# Patient Record
Sex: Female | Born: 1995 | Race: White | Hispanic: No | Marital: Single | Smoking: Never smoker
Health system: Southern US, Community
[De-identification: ages and names within clinical notes are randomized; demographics above are authoritative.]

## PROBLEM LIST (undated history)

## (undated) DIAGNOSIS — F419 Anxiety disorder, unspecified: Secondary | ICD-10-CM

## (undated) DIAGNOSIS — F909 Attention-deficit hyperactivity disorder, unspecified type: Secondary | ICD-10-CM

## (undated) DIAGNOSIS — T7840XA Allergy, unspecified, initial encounter: Secondary | ICD-10-CM

## (undated) HISTORY — DX: Attention-deficit hyperactivity disorder, unspecified type: F90.9

## (undated) HISTORY — PX: TONSILLECTOMY AND ADENOIDECTOMY: SHX28

## (undated) HISTORY — DX: Anxiety disorder, unspecified: F41.9

## (undated) HISTORY — DX: Allergy, unspecified, initial encounter: T78.40XA

---
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
## 2015-01-15 ENCOUNTER — Encounter: Payer: Self-pay | Admitting: Internal Medicine

## 2017-04-17 ENCOUNTER — Encounter: Payer: Self-pay | Admitting: Physician Assistant

## 2017-04-17 ENCOUNTER — Ambulatory Visit (INDEPENDENT_AMBULATORY_CARE_PROVIDER_SITE_OTHER): Payer: BC Managed Care – PPO | Admitting: Physician Assistant

## 2017-04-17 VITALS — BP 112/75 | HR 80 | Temp 97.9°F | Resp 16 | Ht 65.0 in | Wt 162.6 lb

## 2017-04-17 DIAGNOSIS — F410 Panic disorder [episodic paroxysmal anxiety] without agoraphobia: Secondary | ICD-10-CM | POA: Diagnosis not present

## 2017-04-17 DIAGNOSIS — F902 Attention-deficit hyperactivity disorder, combined type: Secondary | ICD-10-CM | POA: Diagnosis not present

## 2017-04-17 DIAGNOSIS — F411 Generalized anxiety disorder: Secondary | ICD-10-CM | POA: Diagnosis not present

## 2017-04-17 MED ORDER — ALPRAZOLAM 0.5 MG PO TABS: 0.2500 mg | ORAL_TABLET | Freq: Every evening | ORAL | 0 refills | Status: DC | PRN

## 2017-04-17 MED ORDER — DESVENLAFAXINE SUCCINATE ER 50 MG PO TB24: 50.0000 mg | ORAL_TABLET | Freq: Every day | ORAL | 1 refills | Status: DC

## 2017-04-17 NOTE — Progress Notes (Signed)
Colleen Fuller  MRN: [id number] DOB: 1996/02/02  PCP: Patient, No Pcp Per  Chief Complaint  Patient presents with  . Anxiety    for 2 years ago     Subjective:  Pt presents to clinic for recheck of anxiety.  Pt is a Consulting civil engineer at [employer] and she has been seen in her student health by me.  She has GAD with panic and ADHD.  She is treated at the [facility] attention specialist for her ADHD and while treating her her anxiety worsened and they started her on Zoloft - which did not help her anxiety.  At [employer] college over the last semester she was started on lexapro that helped her anxiety but she was still having panic attacks.  Atarax made her to sleepy to function at school.  Buspar was tried and she felt depressive symptoms that she has never had before starting that medication - when she stopped this medication the depressive symptoms completely resolved.  She has recently (within the last 3 weeks) started pristiq and she seems to be tolerating it ok.  Her anxiety is slightly increased with the recent titration off Lexapro in order to start the Pristiq. She has taken Xanax 0.25 about 6 pills in the last 3 weeks - she tries to take them before she gets to the panic attack stage - she is able to talk herself out of them most of the time and the physical symptoms do not occur.     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  She has been on many doses of Adderall XR - when she was on 25mg  her anxiety was much increased but on the 10mg  she feels like her anxiety is better.  She takes a single dose in the am - it lasts about 6 hours - she has a lot of stuff that she needs to focus on that she has to do after the medications ran out.  She has most of her anxiety in the late afternoon/evening. She has noticed a link between her focus/concentration and some of her anxiety. -- she has had ADHD all her life  but she started for college because she wanted to succeed.   She sleeps fine at night even when she takes her Adderal dose in the late afternoon.  Anxiety comes from  - why she cannot get work done - which leads to I am a failure   Pt is wondering if she would prefer to go to a psychiatrist that can treat both her ADHD and her anxiety.   Review of Systems  Psychiatric/Behavioral: Positive for decreased concentration and sleep disturbance. Negative for dysphoric mood and suicidal ideas. The patient is nervous/anxious.     Patient Active Problem List   Diagnosis Date Noted  . GAD (generalized anxiety disorder) 04/17/2017  . Attention deficit hyperactivity disorder (ADHD), combined type 04/17/2017  . Panic attacks 04/17/2017    No current outpatient prescriptions on file prior to visit.   No current facility-administered medications on file prior to visit.     No Known Allergies  Pt patients past, family and social history were reviewed and updated.   Objective:  BP 112/75 (BP Location: Right Arm, Patient Position: Sitting, Cuff Size: Small)   Pulse 80   Temp 97.9 F (36.6 C) (Oral)   Resp 16   Ht 5\' 5"  (1.651 m)  Wt 162 lb 9.6 oz (73.8 kg)   LMP 04/08/2017   SpO2 100%   BMI 27.06 kg/m   Physical Exam  Constitutional: She is oriented to person, place, and time and well-developed, well-nourished, and in no distress.  HENT:  Head: Normocephalic and atraumatic.  Right Ear: Hearing and external ear normal.  Left Ear: Hearing and external ear normal.  Eyes: Conjunctivae are normal.  Neck: Normal range of motion.  Pulmonary/Chest: Effort normal.  Neurological: She is alert and oriented to person, place, and time. Gait normal.  Skin: Skin is warm and dry.  Psychiatric: Mood, memory, affect and judgment normal.  Vitals reviewed.   Assessment and Plan :  GAD (generalized anxiety disorder) - Plan: desvenlafaxine (PRISTIQ) 50 MG 24 hr tablet  Attention deficit  hyperactivity disorder (ADHD), combined type  Panic attacks - Plan: desvenlafaxine (PRISTIQ) 50 MG 24 hr tablet, ALPRAZolam (XANAX) 0.5 MG tablet   Increase her Pristiq to 50mg  - pt should see some benefit in the 3-4 week time prior to her next visit - her dose may need to be increased at her next visit.  We discussed that she is going to be really aware of when her anxiety is and is not related her her lack of concentration and focus from her ADHD.  We discussed that her dose of Adderall may not be lasting long enough - ? Adderall XR 10mg  bid dosing or change to Mydayis or Vyvanse and she plans to monitor this and then [facility] for possible medication adjustment.  Gave her some names of specialist that can treatment all her conditions because I suspect they are more intertwined than previously thought.  Benny LennertSarah Ellyse Rotolo PA-C  Primary Care at [facility] 04/17/2017 10:21 AM

## 2017-04-17 NOTE — Patient Instructions (Addendum)
[place] [facility] --     IF you received an x-ray today, you will receive an invoice from [facility]. Please contact [facility] at [phone] with questions or concerns regarding your invoice.   IF you received labwork today, you will receive an invoice from [facility]. Please contact [facility] at [phone] with questions or concerns regarding your invoice.   Our billing staff will not be able to assist you with questions regarding bills from these companies.  You will be contacted with the lab results as soon as they are available. The fastest way to get your results is to activate your My Chart account. Instructions are located on the last page of this paperwork. If you have not heard from us regarding the results in 2 weeks, please contact this office.

## 2017-05-15 ENCOUNTER — Ambulatory Visit: Payer: BC Managed Care – PPO | Admitting: Physician Assistant

## 2017-05-28 ENCOUNTER — Telehealth: Payer: Self-pay | Admitting: Physician Assistant

## 2017-05-28 NOTE — Telephone Encounter (Signed)
Pt informed that she should have 1 refill left.

## 2017-05-28 NOTE — Telephone Encounter (Signed)
PATIENT WOULD LIKE SARAH TO KNOW THAT SHE NEEDS A REFILL ON HER DESVENLAFAXINE (PRISTIQ) 50 MG 24 HOUR TABLET. HER SCHEDULE FOR AN APPOINTMENT DID NOT WORK WITH SARAH'S FOR NEXT WEEK. AND SARAH IS ON VACATION THIS WEEK AND THE FOLLOWING WEEK. BEST PHONE [phone] (CELL) PHARMACY CHOICE IS [place]. [facility]BC

## 2017-06-11 ENCOUNTER — Encounter (INDEPENDENT_AMBULATORY_CARE_PROVIDER_SITE_OTHER): Payer: Self-pay | Admitting: Orthopaedic Surgery

## 2017-06-11 ENCOUNTER — Ambulatory Visit (INDEPENDENT_AMBULATORY_CARE_PROVIDER_SITE_OTHER): Payer: BC Managed Care – PPO | Admitting: Orthopaedic Surgery

## 2017-06-11 ENCOUNTER — Ambulatory Visit (INDEPENDENT_AMBULATORY_CARE_PROVIDER_SITE_OTHER): Payer: Self-pay

## 2017-06-11 DIAGNOSIS — M25571 Pain in right ankle and joints of right foot: Secondary | ICD-10-CM | POA: Diagnosis not present

## 2017-06-11 NOTE — Progress Notes (Signed)
Office Visit Note   Patient: Colleen Fuller           Date of Birth: 1996/11/15           MRN: [id number] Visit Date: 06/11/2017              Requested by: No referring provider defined for this encounter. PCP: Patient, No Pcp Per   Assessment & Plan: Visit Diagnoses:  1. Pain in right ankle and joints of right foot     Plan: Overall impression is posterior tibial tendinitis. Recommend physical therapy with modalities, schedule NSAIDs then as needed. Questions encouraged and answered. ASO brace was provided today. Follow-up as needed.  Follow-Up Instructions: Return if symptoms worsen or fail to improve.   Orders:  Orders Placed This Encounter  Procedures  . XR Ankle Complete Right   No orders of the defined types were placed in this encounter.     Procedures: No procedures performed   Clinical Data: No additional findings.   Subjective: Chief Complaint  Patient presents with  . 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 She denies any injuries. Denies any real swelling or numbness and tingling.    Review of Systems  Constitutional: Negative.   HENT: Negative.   Eyes: Negative.   Respiratory: Negative.   Cardiovascular: Negative.   Endocrine: Negative.   Musculoskeletal: Negative.   Neurological: Negative.   Hematological: Negative.   Psychiatric/Behavioral: Negative.   All other systems reviewed and are negative.    Objective: Vital Signs: There were no vitals taken for this visit.  Physical Exam  Constitutional: She is oriented to person, place, and time. She appears well-developed and well-nourished.  HENT:  Head: Normocephalic and atraumatic.  Eyes: EOM are normal.  Neck: Neck supple.  Pulmonary/Chest: Effort normal.  Abdominal: Soft.  Neurological: She is alert and oriented to person, place, and time.  Skin: Skin is warm. Capillary refill takes less  than 2 seconds.  Psychiatric: She has a normal mood and affect. Her behavior is normal. Judgment and thought content normal.  Nursing note and vitals reviewed.   Ortho Exam Right ankle exam shows tenderness along the posterior tibial tendon. Achilles is nontender. Plantar fascia is nontender. No evidence of significant swelling or skin changes. Foot is warm well-perfused. Specialty Comments:  No specialty comments available.  Imaging: Xr Ankle Complete Right  Result Date: 06/11/2017 No acute or structural findings    PMFS History: Patient Active Problem List   Diagnosis Date Noted  . GAD (generalized anxiety disorder) 04/17/2017  . Attention deficit hyperactivity disorder (ADHD), combined type 04/17/2017  . Panic attacks 04/17/2017   Past Medical History:  Diagnosis Date  . ADHD   . Allergy   . Anxiety     Family History  Problem Relation Age of Onset  . Mental illness Mother   . Cancer Maternal Grandmother     Past Surgical History:  Procedure Laterality Date  . TONSILLECTOMY AND ADENOIDECTOMY Bilateral    age 89   Social History   Occupational History  . student    Social History Main Topics  . Smoking status: Never Smoker  . Smokeless tobacco: Never Used  . Alcohol use Yes     Comment: 3 per year   . Drug use: Yes    Types: Marijuana     Comment: rarely   . Sexual activity: Not  on file

## 2017-06-27 ENCOUNTER — Encounter: Payer: Self-pay | Admitting: Physician Assistant

## 2017-06-27 ENCOUNTER — Ambulatory Visit (INDEPENDENT_AMBULATORY_CARE_PROVIDER_SITE_OTHER): Payer: BC Managed Care – PPO | Admitting: Physician Assistant

## 2017-06-27 VITALS — BP 112/70 | HR 75 | Temp 98.5°F | Resp 16 | Ht 65.0 in | Wt 164.2 lb

## 2017-06-27 DIAGNOSIS — F410 Panic disorder [episodic paroxysmal anxiety] without agoraphobia: Secondary | ICD-10-CM | POA: Diagnosis not present

## 2017-06-27 DIAGNOSIS — F902 Attention-deficit hyperactivity disorder, combined type: Secondary | ICD-10-CM

## 2017-06-27 DIAGNOSIS — F411 Generalized anxiety disorder: Secondary | ICD-10-CM | POA: Diagnosis not present

## 2017-06-27 MED ORDER — ALPRAZOLAM 0.5 MG PO TABS: 0.2500 mg | ORAL_TABLET | Freq: Every evening | ORAL | 0 refills | Status: DC | PRN

## 2017-06-27 MED ORDER — DESVENLAFAXINE SUCCINATE ER 50 MG PO TB24: 50.0000 mg | ORAL_TABLET | Freq: Every day | ORAL | 0 refills | Status: DC

## 2017-06-27 NOTE — Patient Instructions (Addendum)
[facility]   IF you received an x-ray today, you will receive an invoice from [facility]. Please contact [facility] at [phone] with questions or concerns regarding your invoice.   IF you received labwork today, you will receive an invoice from [facility]. Please contact [facility] at [phone] with questions or concerns regarding your invoice.   Our billing staff will not be able to assist you with questions regarding bills from these companies.  You will be contacted with the lab results as soon as they are available. The fastest way to get your results is to activate your My Chart account. Instructions are located on the last page of this paperwork. If you have not heard from us regarding the results in 2 weeks, please contact this office.

## 2017-06-27 NOTE — Progress Notes (Signed)
Colleen Fuller  MRN: [id number] DOB: 28-Oct-1996  PCP: Patient, No Pcp Per  Chief Complaint  Patient presents with  . Medication Refill    refill for Xanax and Prystiq    Subjective:  Pt presents to clinic for follow up on pristiq and xanax.  Patient transitioned from lexapro to pristiq. Likes the combination of pristiq and xanax. She takes the pristiq mid morning to noon. Before she takes the medication, she feels lightheaded and dizzy, then takes the medication with food and these symptoms go away. She has not been eating large breakfasts, only a piece of fruit. She has not tried eating a large meal in the morning to see if this makes a difference. She says that it is easier while in college to eat large meals because there is a cafeteria. She admits to drinking 2 cups a water a day and not does feel lightheaded or dizzy after standing up quickly.  Feels she has had less break through panic attacks because the medication makes them easier to handle. Her panic attacks most commonly happen at night, but feels they do not escalate to previous levels of panic. She attributes part of this to taking a xanax when needed, and she averages 1 xanax a week.  She only uses the xanax when she has not been able to stop them with her coping techniques that she has learned through therapy.  Pt is interested in STD testing but she really does not want blood work done - she has ahad gonorrhea and chlamydia but has not had other due to the blood draw testing.  Review of Systems  Eyes: Positive for visual disturbance (with panic attacks).  Musculoskeletal: Negative for myalgias.  Neurological: Positive for dizziness and light-headedness.  Psychiatric/Behavioral: Negative for dysphoric mood and sleep disturbance. The patient is nervous/anxious.     Patient Active Problem List   Diagnosis Date Noted  . GAD (generalized anxiety disorder) 04/17/2017  . Attention deficit hyperactivity disorder (ADHD),  combined type 04/17/2017  . Panic attacks 04/17/2017    Current Outpatient Prescriptions on File Prior to Visit  Medication Sig Dispense Refill  . amphetamine-dextroamphetamine (ADDERALL XR) 10 MG 24 hr capsule Take 10 mg by mouth daily.    . ORSYTHIA 0.1-20 MG-MCG tablet      No current facility-administered medications on file prior to visit.     No Known Allergies  Past Medical History:  Diagnosis Date  . ADHD   . Allergy   . Anxiety    Social History   Social History Narrative   [place] [employer] - major [employer]   Social History  Substance Use Topics  . Smoking status: Never Smoker  . Smokeless tobacco: Never Used  . Alcohol use Yes     Comment: 3 per year    family history includes Cancer in her maternal grandmother; Mental illness in her mother.     Objective:  BP 112/70 (BP Location: Right Arm, Patient Position: Sitting, Cuff Size: Normal)   Pulse 75   Temp 98.5 F (36.9 C) (Oral)   Resp 16   Ht 5\' 5"  (1.651 m)   Wt 164 lb 3.2 oz (74.5 kg)   LMP 06/27/2017   SpO2 98%   BMI 27.32 kg/m  Body mass index is 27.32 kg/m.  Physical Exam  Constitutional: She is oriented to person, place, and time and well-developed, well-nourished, and in no distress.  HENT:  Head: Normocephalic and atraumatic.  Right Ear: Hearing and external ear  normal.  Left Ear: Hearing and external ear normal.  Eyes: Conjunctivae are normal.  Neck: Normal range of motion.  Cardiovascular: Normal rate, regular rhythm and normal heart sounds.   No murmur heard. Pulmonary/Chest: Effort normal and breath sounds normal. She has no wheezes.  Neurological: She is alert and oriented to person, place, and time. Gait normal.  Skin: Skin is warm and dry.  Psychiatric: Mood, memory, affect and judgment normal.  Vitals reviewed.   Assessment and Plan :  Attention deficit hyperactivity disorder (ADHD), combined type  GAD (generalized anxiety disorder) - Plan: desvenlafaxine (PRISTIQ) 50  MG 24 hr tablet  Panic attacks - Plan: desvenlafaxine (PRISTIQ) 50 MG 24 hr tablet, ALPRAZolam (XANAX) 0.5 MG tablet  For now patients wants to keep her medication the same and wait for school to start to see if anything changes.  We did discuss that I think that we could increase her medication for an improved response not that she is using to much xanax but she is still having anxiety attacks daily.  She also plans to see how the semester goes but I think that she would benefit from a longer acting stimulant as I suspect that is part of her increase nighttime anxiety,  She needs to talk with [place] attention Specialist.   PT given names of locations that will likely do the HIV saliva test she will call and get that information.  Benny LennertSarah Hanna Aultman PA-C  Primary Care at [facility] 07/02/2017 1:48 PM

## 2017-10-24 ENCOUNTER — Encounter: Payer: Self-pay | Admitting: Family Medicine

## 2017-10-24 ENCOUNTER — Ambulatory Visit: Payer: BC Managed Care – PPO | Admitting: Family Medicine

## 2017-10-24 ENCOUNTER — Other Ambulatory Visit: Payer: Self-pay

## 2017-10-24 VITALS — BP 110/62 | HR 83 | Temp 98.7°F | Resp 17 | Ht 65.0 in | Wt 173.2 lb

## 2017-10-24 DIAGNOSIS — F411 Generalized anxiety disorder: Secondary | ICD-10-CM

## 2017-10-24 DIAGNOSIS — F410 Panic disorder [episodic paroxysmal anxiety] without agoraphobia: Secondary | ICD-10-CM

## 2017-10-24 DIAGNOSIS — F902 Attention-deficit hyperactivity disorder, combined type: Secondary | ICD-10-CM | POA: Diagnosis not present

## 2017-10-24 MED ORDER — DESVENLAFAXINE SUCCINATE ER 50 MG PO TB24: 50.0000 mg | ORAL_TABLET | Freq: Every day | ORAL | 0 refills | Status: DC

## 2017-10-24 MED ORDER — ALPRAZOLAM 0.5 MG PO TABS: 0.2500 mg | ORAL_TABLET | Freq: Every evening | ORAL | 0 refills | Status: DC | PRN

## 2017-10-24 NOTE — Patient Instructions (Signed)
     IF you received an x-ray today, you will receive an invoice from [facility]. Please contact [facility] at [phone] with questions or concerns regarding your invoice.   IF you received labwork today, you will receive an invoice from [facility]. Please contact [facility] at [phone] with questions or concerns regarding your invoice.   Our billing staff will not be able to assist you with questions regarding bills from these companies.  You will be contacted with the lab results as soon as they are available. The fastest way to get your results is to activate your My Chart account. Instructions are located on the last page of this paperwork. If you have not heard from us regarding the results in 2 weeks, please contact this office.     

## 2017-10-24 NOTE — Progress Notes (Signed)
Chief Complaint  Patient presents with  . Medication Refill    xanax, pristiq    HPI  Pt reports that her mood is related to where school is right now She is going through finals and is not sleeping well which is affecting her mood She reports that her anxiety is slightly worse since the end of the semester has come around She studies theatre  Pt has a diagnosis of ADD/ADHD She is taking adderall daily and her pristiq daily and has good results She does not use other stimulants such as energy drinks or caffeine She is able to sleep at night and denies unintentional weight loss She denies mood swings She skips doses when not working or on weekends  Wt Readings from Last 3 Encounters:  10/24/17 173 lb 3.2 oz (78.6 kg)  06/27/17 164 lb 3.2 oz (74.5 kg)  04/17/17 162 lb 9.6 oz (73.8 kg)     Past Medical History:  Diagnosis Date  . ADHD   . Allergy   . Anxiety     Current Outpatient Medications  Medication Sig Dispense Refill  . ALPRAZolam (XANAX) 0.5 MG tablet Take 0.5-1 tablets (0.25-0.5 mg total) by mouth at bedtime as needed for anxiety. 15 tablet 0  . amphetamine-dextroamphetamine (ADDERALL XR) 10 MG 24 hr capsule Take 10 mg by mouth daily.    Marland Kitchen. desvenlafaxine (PRISTIQ) 50 MG 24 hr tablet Take 1 tablet (50 mg total) by mouth daily. 90 tablet 0  . ORSYTHIA 0.1-20 MG-MCG tablet      No current facility-administered medications for this visit.     Allergies: Not on File  Past Surgical History:  Procedure Laterality Date  . TONSILLECTOMY AND ADENOIDECTOMY Bilateral    age 89    Social History   Socioeconomic History  . Marital status: Single    Spouse name: None  . Number of children: None  . Years of education: None  . Highest education level: None  Social Needs  . Financial resource strain: None  . Food insecurity - worry: None  . Food insecurity - inability: None  . Transportation needs - medical: None  . Transportation needs - non-medical: None    Occupational History  . Occupation: Consulting civil engineerstudent  Tobacco Use  . Smoking status: Never Smoker  . Smokeless tobacco: Never Used  Substance and Sexual Activity  . Alcohol use: Yes    Comment: 3 per year   . Drug use: Yes    Types: Marijuana    Comment: rarely   . Sexual activity: None  Other Topics Concern  . None  Social History Narrative   [place] [employer] student - major theater    Family History  Problem Relation Age of Onset  . Mental illness Mother   . Cancer Maternal Grandmother      ROS Review of Systems See HPI Constitution: No fevers or chills No malaise No diaphoresis Skin: No rash or itching Eyes: no blurry vision, no double vision GU: no dysuria or hematuria Neuro: no dizziness or headaches all others reviewed and negative   Objective: Vitals:   10/24/17 1433  BP: 110/62  Pulse: 83  Resp: 17  Temp: 98.7 F (37.1 C)  TempSrc: Oral  SpO2: 100%  Weight: 173 lb 3.2 oz (78.6 kg)  Height: 5\' 5"  (1.651 m)    Physical Exam  Constitutional: She is oriented to person, place, and time. She appears well-developed and well-nourished.  HENT:  Head: Normocephalic and atraumatic.  Eyes: Conjunctivae and EOM are normal.  Cardiovascular: Normal rate, regular rhythm and normal heart sounds.  Pulmonary/Chest: Effort normal and breath sounds normal. No stridor. No respiratory distress.  Neurological: She is alert and oriented to person, place, and time.  Psychiatric: She has a normal mood and affect. Her behavior is normal. Judgment and thought content normal.    Assessment and Plan [place] was seen today for medication refill.  Diagnoses and all orders for this visit:  GAD (generalized anxiety disorder)- continue current meds -     desvenlafaxine (PRISTIQ) 50 MG 24 hr tablet; Take 1 tablet (50 mg total) by mouth daily.  Panic attacks- advised cpm -     desvenlafaxine (PRISTIQ) 50 MG 24 hr tablet; Take 1 tablet (50 mg total) by mouth daily. -     ALPRAZolam  (XANAX) 0.5 MG tablet; Take 0.5-1 tablets (0.25-0.5 mg total) by mouth at bedtime as needed for anxiety.  Attention deficit hyperactivity disorder (ADHD), combined type- continue current meds       Rosamaria Donn A Creta LevinStallings

## 2019-11-10 ENCOUNTER — Other Ambulatory Visit: Payer: Self-pay

## 2019-11-10 DIAGNOSIS — Z20822 Contact with and (suspected) exposure to covid-19: Secondary | ICD-10-CM

## 2019-11-11 LAB — NOVEL CORONAVIRUS, NAA: SARS-CoV-2, NAA: NOT DETECTED

## 2020-01-17 ENCOUNTER — Ambulatory Visit: Payer: Self-pay | Attending: Internal Medicine

## 2020-01-17 DIAGNOSIS — Z23 Encounter for immunization: Secondary | ICD-10-CM | POA: Insufficient documentation

## 2020-01-17 NOTE — Progress Notes (Signed)
   Covid-19 Vaccination Clinic  Name:  Colleen Fuller    MRN: [id number] DOB: Jul 09, 1996  01/17/2020  Colleen Fuller was observed post Covid-19 immunization for 15 minutes without incidence. She was provided with Vaccine Information Sheet and instruction to access the V-Safe system.   Colleen Fuller was instructed to call 911 with any severe reactions post vaccine: Marland Kitchen Difficulty breathing  . Swelling of your face and throat  . A fast heartbeat  . A bad rash all over your body  . Dizziness and weakness    Immunizations Administered    Name Date Dose VIS Date Route   Pfizer COVID-19 Vaccine 01/17/2020  9:19 AM 0.3 mL 10/31/2019 Intramuscular   Manufacturer: [employer], [employer]   Lot: [id number]   NDC: [address]-4

## 2020-02-07 ENCOUNTER — Ambulatory Visit: Payer: Self-pay | Attending: Internal Medicine

## 2020-02-07 ENCOUNTER — Ambulatory Visit: Payer: Self-pay

## 2020-02-07 DIAGNOSIS — Z23 Encounter for immunization: Secondary | ICD-10-CM

## 2020-02-07 NOTE — Progress Notes (Signed)
   Covid-19 Vaccination Clinic  Name:  Colleen Fuller    MRN: [id number] DOB: 1995-12-12  02/07/2020  Colleen Fuller was observed post Covid-19 immunization for 15 minutes without incident. She was provided with Vaccine Information Sheet and instruction to access the V-Safe system.   Colleen Fuller was instructed to call 911 with any severe reactions post vaccine: Marland Kitchen Difficulty breathing  . Swelling of face and throat  . A fast heartbeat  . A bad rash all over body  . Dizziness and weakness   Immunizations Administered    Name Date Dose VIS Date Route   Pfizer COVID-19 Vaccine 02/07/2020  1:06 PM 0.3 mL 10/31/2019 Intramuscular   Manufacturer: [employer], [employer]   Lot: [id number]   NDC: [address]-0

## 2020-02-11 ENCOUNTER — Ambulatory Visit: Payer: Self-pay

## 2020-03-24 ENCOUNTER — Ambulatory Visit: Payer: Self-pay | Admitting: Family Medicine

## 2020-07-12 ENCOUNTER — Encounter: Payer: Self-pay | Admitting: Family Medicine

## 2020-07-12 ENCOUNTER — Other Ambulatory Visit: Payer: Self-pay

## 2020-07-12 ENCOUNTER — Ambulatory Visit (INDEPENDENT_AMBULATORY_CARE_PROVIDER_SITE_OTHER): Payer: BC Managed Care – PPO | Admitting: Family Medicine

## 2020-07-12 VITALS — BP 132/82 | HR 91 | Temp 99.8°F | Resp 17 | Ht 66.0 in | Wt 221.0 lb

## 2020-07-12 DIAGNOSIS — F410 Panic disorder [episodic paroxysmal anxiety] without agoraphobia: Secondary | ICD-10-CM

## 2020-07-12 DIAGNOSIS — F411 Generalized anxiety disorder: Secondary | ICD-10-CM | POA: Diagnosis not present

## 2020-07-12 DIAGNOSIS — F902 Attention-deficit hyperactivity disorder, combined type: Secondary | ICD-10-CM

## 2020-07-12 DIAGNOSIS — E669 Obesity, unspecified: Secondary | ICD-10-CM

## 2020-07-12 MED ORDER — ALPRAZOLAM 0.5 MG PO TABS: 0.5000 mg | ORAL_TABLET | Freq: Every day | ORAL | 0 refills | Status: AC | PRN

## 2020-07-12 NOTE — Patient Instructions (Signed)
Please return in 3-6 months for a physical if you are comfortable.   I have ordered xanax for you to use for panic attacks.  I believe working on improving your mental health with Verlon Au or another psychiatrist is important. Let me know if I can help.  [facility] is a great group to consider.   If you have any questions or concerns, please don't hesitate to send me a message via MyChart or call the office at [phone]. Thank you for visiting with [place] today! It's our pleasure caring for you.

## 2020-07-12 NOTE — Progress Notes (Signed)
Subjective  CC:  Chief Complaint  Patient presents with  . New Patient (Initial Visit)    Colleen Fuller  . Anxiety    Colleen Fuller manages meds     HPI: Colleen Fuller is a 24 y.o. female who presents to [facility] today to establish care with me as a new patient.   She has the following concerns or needs:  24 year old female here to establish care.  Last complete physical was several years ago.  She suffers from general anxiety disorder and possibly other mental health disorders and panic attacks.  Currently seeing psychiatry who has her on Abilify and buspirone.  Unfortunately she does not feel that her mood is well controlled.  She suffers from panic attacks at least monthly.  Triggers include doctor visits, blood draws and needles.  She has been to an OB/GYN in the past but could not complete a pelvic exam due to a panic attack.  She has been sexually active although it was not a completely appropriate experience.  She denies forced sexual intercourse but says "it was mostly what he wanted".  To my knowledge, she has not had a Pap smear.  She is overdue for a physical exam.  She does want to make sure that she is healthy.  Physically she reports that she feels well.  She has been diagnosed with ADD but is no longer on medications.  She has been treated by [place] attention specialist.  In the past, she used Xanax about monthly for panic attacks.  Currently, her psychiatrist will not prescribe this for her.  I did review her [place] drug database and it was appropriate.  Social history: She will be moving into an apartment within the next month and will be living independently.  She is happy about this.  She is a Nurse, learning disability.  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  She did graduate from college last year.  She has a good relationship with her mother lives in [place].  Her father lives in [place].  She has  a brother.  She reports a good social system and network of friends.  She does not use drugs or alcohol.  She does not smoke.  Assessment  1. Panic attack   2. GAD (generalized anxiety disorder)   3. Attention deficit hyperactivity disorder (ADHD), combined type   4. Obesity (BMI 30-39.9)      Plan   Panic attacks General anxiety disorder: Acute panic attack triggered by the thought of a blood draw in the office.  Deep breathing exercises done.  Recommend Xanax for use for panic attacks.  She will follow-up with psychiatry.  Unfortunately her mood is not well controlled currently.  ADD: She defers treatment for this at this time.  Wellness: She is due for a wellness visit.  We will need to get her mood and panic under control first.  Follow up: CPE at her convenience when she is comfortable. No orders of the defined types were placed in this encounter.  Meds ordered this encounter  Medications  . ALPRAZolam (XANAX) 0.5 MG tablet    Sig: Take 1 tablet (0.5 mg total) by mouth daily as needed for anxiety.    Dispense:  30 tablet    Refill:  0     Depression screen [facility] 2/9 10/24/2017 06/27/2017 04/17/2017  Decreased Interest 1 0 0  Down, Depressed, Hopeless 1 0 0  PHQ - 2  Score 2 0 0  Altered sleeping 0 - -  Tired, decreased energy 2 - -  Change in appetite 1 - -  Feeling bad or failure about yourself  2 - -  Trouble concentrating 2 - -  Moving slowly or fidgety/restless 1 - -  Suicidal thoughts 0 - -  PHQ-9 Score 10 - -    We updated and reviewed the patient's past history in detail and it is documented below.  Patient Active Problem List   Diagnosis Date Noted  . Obesity (BMI 30-39.9) 07/12/2020  . GAD (generalized anxiety disorder) 04/17/2017    Zoloft - no help Lexapro - helped but still had panic Buspar - depressive SE  Current Pristiq   . Attention deficit hyperactivity disorder (ADHD), combined type 04/17/2017    [place] Attention Specialist   . Panic attacks  04/17/2017   Health Maintenance  Topic Date Due  . Hepatitis C Screening  Never done  . HIV Screening  Never done  . TETANUS/TDAP  Never done  . PAP-Cervical Cytology Screening  Never done  . PAP SMEAR-Modifier  Never done  . INFLUENZA VACCINE  06/20/2020  . COVID-19 Vaccine  Completed   Immunization History  Administered Date(s) Administered  . PFIZER SARS-COV-2 Vaccination 01/17/2020, 02/07/2020   Current Meds  Medication Sig  . ARIPiprazole (ABILIFY) 15 MG tablet Take 15 mg by mouth daily.  . busPIRone (BUSPAR) 10 MG tablet Take 10 mg by mouth 2 (two) times daily.    Allergies: Patient has no allergies on file. Past Medical History Patient  has a past medical history of ADHD, Allergy, and Anxiety. Past Surgical History Patient  has a past surgical history that includes Tonsillectomy and adenoidectomy (Bilateral). Family History: Patient family history includes Cancer in her maternal grandmother; Mental illness in her mother. Social History:  Patient  reports that she has never smoked. She has never used smokeless tobacco. She reports current alcohol use. She reports current drug use. Drug: Marijuana.  Review of Systems: Constitutional: negative for fever or malaise Ophthalmic: negative for photophobia, double vision or loss of vision Cardiovascular: negative for chest pain, dyspnea on exertion, or new LE swelling Respiratory: negative for SOB or persistent cough Gastrointestinal: negative for abdominal pain, change in bowel habits or melena Genitourinary: negative for dysuria or gross hematuria Musculoskeletal: negative for new gait disturbance or muscular weakness Integumentary: negative for new or persistent rashes Neurological: negative for TIA or stroke symptoms Psychiatric: negative for SI or delusions Allergic/Immunologic: negative for hives  Patient Care Team    Relationship Specialty Notifications Start End  Willow Ora, MD PCP - General Family Medicine   07/12/20     Objective  Vitals: BP 132/82   Pulse 91   Temp 99.8 F (37.7 C) (Temporal)   Resp 17   Ht 5\' 6"  (1.676 m)   Wt 221 lb (100.2 kg)   SpO2 98%   BMI 35.67 kg/m  General:  Well developed, well nourished, no acute distress initially Psych:  Alert and oriented,normal mood and affect, acute panic attack with hyperventilation crying and tearfulness during the office visit. Physical exam was thus deferred.  Commons side effects, risks, benefits, and alternatives for medications and treatment plan prescribed today were discussed, and the patient expressed understanding of the given instructions. Patient is instructed to call or message via MyChart if he/she has any questions or concerns regarding our treatment plan. No barriers to understanding were identified. We discussed Red Flag symptoms and signs in detail.  Patient expressed understanding regarding what to do in case of urgent or emergency type symptoms.   Medication list was reconciled, printed and provided to the patient in AVS. Patient instructions and summary information was reviewed with the patient as documented in the AVS. This note was prepared with assistance of Dragon voice recognition software. Occasional wrong-word or sound-a-like substitutions may have occurred due to the inherent limitations of voice recognition software  This visit occurred during the SARS-CoV-2 public health emergency.  Safety protocols were in place, including screening questions prior to the visit, additional usage of staff PPE, and extensive cleaning of exam room while observing appropriate contact time as indicated for disinfecting solutions.

## 2020-10-18 ENCOUNTER — Encounter: Payer: BC Managed Care – PPO | Admitting: Family Medicine

## 2020-10-26 ENCOUNTER — Other Ambulatory Visit: Payer: Self-pay

## 2020-10-26 ENCOUNTER — Encounter: Payer: Self-pay | Admitting: Family Medicine

## 2020-10-26 ENCOUNTER — Ambulatory Visit (INDEPENDENT_AMBULATORY_CARE_PROVIDER_SITE_OTHER): Payer: BC Managed Care – PPO | Admitting: Family Medicine

## 2020-10-26 VITALS — BP 134/88 | HR 91 | Temp 99.0°F | Ht 66.0 in | Wt 225.6 lb

## 2020-10-26 DIAGNOSIS — F40231 Fear of injections and transfusions: Secondary | ICD-10-CM | POA: Diagnosis not present

## 2020-10-26 DIAGNOSIS — Z0000 Encounter for general adult medical examination without abnormal findings: Secondary | ICD-10-CM | POA: Diagnosis not present

## 2020-10-26 DIAGNOSIS — F410 Panic disorder [episodic paroxysmal anxiety] without agoraphobia: Secondary | ICD-10-CM | POA: Diagnosis not present

## 2020-10-26 DIAGNOSIS — F411 Generalized anxiety disorder: Secondary | ICD-10-CM | POA: Diagnosis not present

## 2020-10-26 NOTE — Progress Notes (Signed)
Subjective  Chief Complaint  Patient presents with  . Annual Exam    fasting    HPI: Colleen Fuller is a 24 y.o. female who presents to [facility] at [facility] today for a Female Wellness Visit.  She also has the concerns and/or needs as listed above in the chief complaint. These will be addressed in addition to the Health Maintenance Visit.   Wellness Visit: annual visit with health maintenance review and exam without Pap  Health maintenance: Patient return for complete physical in spite of her severe anxiety symptoms.  She did take Xanax 0.5 mg prior to this visit.  She remains very anxious, tearful and panic.  She denies history of prior abuse that she is aware of.  She is due immunizations and Pap smear by does not feel like she could have these done today due to her anxiety.  We discussed the importance of HPV vaccine especially the setting of not being able to do routine Pap smears.  She says she would consider it.  She would need a friend to come to help support her during the visit.  She declines the flu vaccine and tetanus vaccine at this point.  She is due for chlamydia and gonorrhea screening but defers to next visit.  She reports a history of chlamydia that was treated several years ago.  She has not been sexually active since.  She is in a long-distance relationship and is not sexually active.  She does not need birth control at this time.  Chronic disease management visit and/or acute problem visit:  Severe anxiety: Overall doing a little bit better, working with psych. Need xanax twice for panic attacks and it helped.   Assessment  1. Annual physical exam   2. Severe needle phobia   3. GAD (generalized anxiety disorder)   4. Panic attacks      Plan  Female Wellness Visit:  Age appropriate Health Maintenance and Prevention measures were discussed with patient. Included topics are cancer screening recommendations, ways to keep healthy (see AVS) including  dietary and exercise recommendations, regular eye and dental care, use of seat belts, and avoidance of moderate alcohol use and tobacco use. Defer pap; rec hpv vaccines.   BMI: discussed patient's BMI and encouraged positive lifestyle modifications to help get to or maintain a target BMI.  HM needs and immunizations were addressed and ordered. See below for orders. See HM and immunization section for updates. Declines flu vaccine  Defer blood work today due to anxiety and pt reports normal labs last year at prior PCP office.   Discussed recommendations regarding Vit D and calcium supplementation (see AVS)  Chronic disease f/u and/or acute problem visit: (deemed necessary to be done in addition to the wellness visit):  Mood d/o:  Per psych; severe anxiety. Will continue to counsel and reassure to work toward being able to handle medical office visits, blood work and appropriate vaccinations.   Follow up: Return in about 1 year (around 10/26/2021) for complete physical.   No orders of the defined types were placed in this encounter.  No orders of the defined types were placed in this encounter.     Lifestyle: Body mass index is 36.41 kg/m. Wt Readings from Last 3 Encounters:  10/26/20 225 lb 9.6 oz (102.3 kg)  07/12/20 221 lb (100.2 kg)  10/24/17 173 lb 3.2 oz (78.6 kg)     Patient Active Problem List   Diagnosis Date Noted  . Severe needle  phobia 10/26/2020    Declines most imms and blood work   . Obesity (BMI 30-39.9) 07/12/2020  . GAD (generalized anxiety disorder) 04/17/2017    Seeing psychiatry: not well controlled. Severe sxs around medical care and needles  Zoloft - no help Lexapro - helped but still had panic Buspar - depressive SE     . Attention deficit hyperactivity disorder (ADHD), combined type 04/17/2017    [place] Attention Specialist   . Panic attacks 04/17/2017   Health Maintenance  Topic Date Due  . PAP SMEAR-Modifier  10/26/2020 (Originally 08/06/2017)   . INFLUENZA VACCINE  02/17/2021 (Originally 06/20/2020)  . PAP-Cervical Cytology Screening  10/26/2021 (Originally 08/06/2017)  . TETANUS/TDAP  10/26/2021 (Originally 08/07/2015)  . Hepatitis C Screening  10/26/2021 (Originally [birth year])  . COVID-19 Vaccine  Completed  . HIV Screening  Completed   Immunization History  Administered Date(s) Administered  . PFIZER SARS-COV-2 Vaccination 01/17/2020, 02/07/2020   We updated and reviewed the patient's past history in detail and it is documented below. Allergies: Patient  reports current alcohol use. Past Medical History Patient  has a past medical history of ADHD, Allergy, and Anxiety. Past Surgical History Patient  has a past surgical history that includes Tonsillectomy and adenoidectomy (Bilateral). Social History   Socioeconomic History  . Marital status: Single    Spouse name: Not on file  . Number of children: Not on file  . Years of education: Not on file  . Highest education level: Not on file  Occupational History  . Occupation: Consulting civil engineer  Tobacco Use  . Smoking status: Never Smoker  . Smokeless tobacco: Never Used  Vaping Use  . Vaping Use: Never used  Substance and Sexual Activity  . Alcohol use: Yes    Comment: 3 per year   . Drug use: Yes    Types: Marijuana    Comment: rarely   . Sexual activity: Not on file  Other Topics Concern  . 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 Difficulty of Paying Living Expenses: Not on file  Food Insecurity:   . Worried About Programme researcher, broadcasting/film/video in the Last Year: Not on file  . Ran Out of Food in the Last Year: Not on file  Transportation Needs:   . Lack of Transportation (Medical): Not on file  . Lack of Transportation (Non-Medical): Not on file  Physical Activity:   . Days of Exercise per Week: Not on file  . Minutes of Exercise per Session: Not on file  Stress:   .  Feeling of Stress : Not on file  Social Connections:   . Frequency of Communication with Friends and Family: Not on file  . Frequency of Social Gatherings with Friends and Family: Not on file  . Attends Religious Services: Not on file  . Active Member of Clubs or Organizations: Not on file  . Attends Banker Meetings: Not on file  . Marital Status: Not on file   Family History  Problem Relation Age of Onset  . Mental illness Mother   . Cancer Maternal Grandmother     Review of Systems: Constitutional: negative for fever or malaise Ophthalmic: negative for photophobia, double vision or loss of vision Cardiovascular: negative for chest pain, dyspnea on exertion, or new LE swelling Respiratory: negative for SOB or persistent cough Gastrointestinal: negative for abdominal pain, change in bowel habits or  melena Genitourinary: negative for dysuria or gross hematuria, no abnormal uterine bleeding or disharge Musculoskeletal: negative for new gait disturbance or muscular weakness Integumentary: negative for new or persistent rashes, no breast lumps Neurological: negative for TIA or stroke symptoms Psychiatric: negative for SI or delusions Allergic/Immunologic: negative for hives  Patient Care Team    Relationship Specialty Notifications Start End  Willow Ora, MD PCP - General Family Medicine  07/12/20     Objective  Vitals: BP 134/88   Pulse 91   Temp 99 F (37.2 C) (Temporal)   Ht 5\' 6"  (1.676 m)   Wt 225 lb 9.6 oz (102.3 kg)   LMP 10/18/2020 (Approximate)   SpO2 99%   BMI 36.41 kg/m  General:  Well developed, well nourished, no acute distress  Psych:  Alert and orientedx3, anxious and tearful, nl cognition HEENT:  Normocephalic, atraumatic, non-icteric sclera, PERRL, supple neck without adenopathy, mass or thyromegaly Cardiovascular:  Normal S1, S2, RRR without gallop, rub or murmur Respiratory:  Good breath sounds bilaterally, CTAB with normal respiratory  effort Gastrointestinal: normal bowel sounds, soft, non-tender, no noted masses. No HSM MSK: no deformities, contusions. Joints are without erythema or swelling.  Skin:  Warm, no rashes or suspicious lesions noted Neurologic:    Mental status is normal. Gross motor and sensory exams are normal. Normal gait. No tremor Breast Exam: No mass, skin retraction or nipple discharge is appreciated in either breast. No axillary adenopathy. Fibrocystic changes are not noted Pelvic Exam: deferred   Commons side effects, risks, benefits, and alternatives for medications and treatment plan prescribed today were discussed, and the patient expressed understanding of the given instructions. Patient is instructed to call or message via MyChart if he/she has any questions or concerns regarding our treatment plan. No barriers to understanding were identified. We discussed Red Flag symptoms and signs in detail. Patient expressed understanding regarding what to do in case of urgent or emergency type symptoms.   Medication list was reconciled, printed and provided to the patient in AVS. Patient instructions and summary information was reviewed with the patient as documented in the AVS. This note was prepared with assistance of Dragon voice recognition software. Occasional wrong-word or sound-a-like substitutions may have occurred due to the inherent limitations of voice recognition software  This visit occurred during the SARS-CoV-2 public health emergency.  Safety protocols were in place, including screening questions prior to the visit, additional usage of staff PPE, and extensive cleaning of exam room while observing appropriate contact time as indicated for disinfecting solutions.

## 2020-10-26 NOTE — Patient Instructions (Addendum)
Please return in 12 months for your annual complete physical; please come fasting.  Consider making an nurse visit appointment to receive your HPV vaccine. You may use xanax first and bring a friend to help. This helps protect your from cervical cancer. We could also get a urine sample at that time to screen for infections that we discussed. (chlamydia and gonorrhea).  If you have any questions or concerns, please don't hesitate to send me a message via MyChart or call the office at [phone]. Thank you for visiting with Colleen Fuller today! It's our pleasure caring for you.  Please do these things to maintain good health!   Exercise at least 30-45 minutes a day,  4-5 days a week.   Eat a low-fat diet with lots of fruits and vegetables, up to 7-9 servings per day.  Drink plenty of water daily. Try to drink 8 8oz glasses per day.  Seatbelts can save your life. Always wear your seatbelt.  Place Smoke Detectors on every level of your home and check batteries every year.  Schedule an appointment with an eye doctor for an eye exam every 1-2 years  Safe sex - use condoms to protect yourself from STDs if you could be exposed to these types of infections. Use birth control if you do not want to become pregnant and are sexually active.  Avoid heavy alcohol use. If you drink, keep it to less than 2 drinks/day and not every day.  Health Care Power of Attorney.  Choose someone you trust that could speak for you if you became unable to speak for yourself.  Depression is common in our stressful world.If you're feeling down or losing interest in things you normally enjoy, please come in for a visit.  If anyone is threatening or hurting you, please get help. Physical or Emotional Violence is never OK.   HPV (Human Papillomavirus) Vaccine: What You Need to Know 1. Why get vaccinated? HPV (Human papillomavirus) vaccine can prevent infection with some types of human papillomavirus. HPV infections can cause  certain types of cancers including:  cervical, vaginal and vulvar cancers in women,  penile cancer in men, and  anal cancers in both men and women. HPV vaccine prevents infection from the HPV types that cause over 90% of these cancers. HPV is spread through intimate skin-to-skin or sexual contact. HPV infections are so common that nearly all men and women will get at least one type of HPV at some time in their lives. Most HPV infections go away by themselves within 2 years. But sometimes HPV infections will last longer and can cause cancers later in life. 2. HPV vaccine HPV vaccine is routinely recommended for adolescents at 97 or 24 years of age to ensure they are protected before they are exposed to the virus. HPV vaccine may be given beginning at age 74 years, and as late as age 30 years. Most people older than 26 years will not benefit from HPV vaccination. Talk with your health care provider if you want more information. Most children who get the first dose before 44 years of age need 2 doses of HPV vaccine. Anyone who gets the first dose on or after 24 years of age, and younger people with certain immunocompromising conditions, need 3 doses. Your health care provider can give you more information. HPV vaccine may be given at the same time as other vaccines. 3. Talk with your health care provider Tell your vaccine provider if the person getting the vaccine:  Has  had an allergic reaction after a previous dose of HPV vaccine, or has any severe, life-threatening allergies.  Is pregnant. In some cases, your health care provider may decide to postpone HPV vaccination to a future visit. People with minor illnesses, such as a cold, may be vaccinated. People who are moderately or severely ill should usually wait until they recover before getting HPV vaccine. Your health care provider can give you more information. 4. Risks of a vaccine reaction  Soreness, redness, or swelling where the shot is  given can happen after HPV vaccine.  Fever or headache can happen after HPV vaccine. People sometimes faint after medical procedures, including vaccination. Tell your provider if you feel dizzy or have vision changes or ringing in the ears. As with any medicine, there is a very remote chance of a vaccine causing a severe allergic reaction, other serious injury, or death. 5. What if there is a serious problem? An allergic reaction could occur after the vaccinated person leaves the clinic. If you see signs of a severe allergic reaction (hives, swelling of the face and throat, difficulty breathing, a fast heartbeat, dizziness, or weakness), call 9-1-1 and get the person to the nearest hospital. For other signs that concern you, call your health care provider. Adverse reactions should be reported to the Vaccine Adverse Event Reporting System (VAERS). Your health care provider will usually file this report, or you can do it yourself. Visit the VAERS website at www.vaers.LAgents.no or call 803-652-4778. VAERS is only for reporting reactions, and VAERS staff do not give medical advice. 6. The National Vaccine Injury Compensation Program The [employer] Vaccine Injury Compensation Program (VICP) is a federal program that was created to compensate people who may have been injured by certain vaccines. Visit the VICP website at SpiritualWord.at or call 629-132-2791 to learn about the program and about filing a claim. There is a time limit to file a claim for compensation. 7. How can I learn more?  Ask your health care provider.  Call your local or state health department.  Contact the Centers for Disease Control and Prevention (CDC): ? Call (956) 345-7525 (1-800-CDC-INFO) or ? Visit CDC's website at PicCapture.uy Vaccine Information Statement HPV Vaccine (09/18/2018) This information is not intended to replace advice given to you by your health care provider. Make sure you discuss any  questions you have with your health care provider. Document Revised: 02/25/2019 Document Reviewed: 06/18/2018 Elsevier Patient Education  2020 [employer].

## 2020-11-10 ENCOUNTER — Telehealth: Payer: Self-pay

## 2020-11-10 NOTE — Telephone Encounter (Signed)
Patient is calling in asking to speak with Dr.Andy, states she is thinking about going off of her pysch medicine and wondered if it would mess up any of her medications that Dr.Andy prescribed.

## 2020-11-10 NOTE — Telephone Encounter (Signed)
Patient states that she has been on and off Buspar and Abilify the past several weeks because she has forgotten to take medication some days but has not noticed any difference in her mood/anxiety. Wanting to stop prescriptions and possibly try herbal alternatives. Wanting to continue Xanax PRN. Please advise if patient can stop, needs to taper, or if appt is needed.

## 2020-11-15 NOTE — Telephone Encounter (Signed)
Patient aware of PCP recommendations. Agreeable to restarting medications and f/u with psych

## 2020-11-15 NOTE — Telephone Encounter (Signed)
I do NOT advise her to stop her medications. Please restart and follow up with psych as recommended.   Xanax is not adequate alone.

## 2020-11-30 ENCOUNTER — Other Ambulatory Visit: Payer: Self-pay

## 2020-11-30 ENCOUNTER — Ambulatory Visit (INDEPENDENT_AMBULATORY_CARE_PROVIDER_SITE_OTHER): Payer: BC Managed Care – PPO

## 2020-11-30 DIAGNOSIS — Z23 Encounter for immunization: Secondary | ICD-10-CM | POA: Diagnosis not present

## 2021-01-06 ENCOUNTER — Other Ambulatory Visit: Payer: Self-pay

## 2021-01-06 ENCOUNTER — Ambulatory Visit (INDEPENDENT_AMBULATORY_CARE_PROVIDER_SITE_OTHER): Payer: BC Managed Care – PPO

## 2021-01-06 DIAGNOSIS — Z23 Encounter for immunization: Secondary | ICD-10-CM

## 2021-01-06 NOTE — Progress Notes (Signed)
Colleen Fuller 25 yr old female presents for her 2nd HPV vaccine per Asencion Partridge, MD. Administered GARDASIL - 9 IM right arm. Patient tolerated well.

## 2021-04-21 ENCOUNTER — Ambulatory Visit (INDEPENDENT_AMBULATORY_CARE_PROVIDER_SITE_OTHER): Payer: BC Managed Care – PPO | Admitting: *Deleted

## 2021-04-21 ENCOUNTER — Other Ambulatory Visit: Payer: Self-pay

## 2021-04-21 ENCOUNTER — Encounter: Payer: Self-pay | Admitting: *Deleted

## 2021-04-21 DIAGNOSIS — Z23 Encounter for immunization: Secondary | ICD-10-CM | POA: Diagnosis not present

## 2021-04-21 NOTE — Progress Notes (Signed)
Per orders of Dr. Mardelle Matte , injection of HPV Gardasil 9 0.5 ml IM given by Corky Mull, LPN in left deltoid. Patient tolerated injection well. Pt has completed the series.

## 2022-02-08 ENCOUNTER — Other Ambulatory Visit: Payer: Self-pay

## 2022-02-08 ENCOUNTER — Encounter (HOSPITAL_COMMUNITY): Payer: Self-pay | Admitting: Emergency Medicine

## 2022-02-08 ENCOUNTER — Emergency Department (HOSPITAL_COMMUNITY): Payer: 59

## 2022-02-08 ENCOUNTER — Emergency Department (HOSPITAL_COMMUNITY)
Admission: EM | Admit: 2022-02-08 | Discharge: 2022-02-08 | Disposition: A | Discharge status: Home / Self Care | Payer: 59 | Attending: Emergency Medicine | Admitting: Emergency Medicine

## 2022-02-08 DIAGNOSIS — F419 Anxiety disorder, unspecified: Secondary | ICD-10-CM | POA: Insufficient documentation

## 2022-02-08 DIAGNOSIS — R109 Unspecified abdominal pain: Secondary | ICD-10-CM | POA: Insufficient documentation

## 2022-02-08 DIAGNOSIS — Z20822 Contact with and (suspected) exposure to covid-19: Secondary | ICD-10-CM | POA: Insufficient documentation

## 2022-02-08 DIAGNOSIS — K429 Umbilical hernia without obstruction or gangrene: Secondary | ICD-10-CM | POA: Diagnosis not present

## 2022-02-08 DIAGNOSIS — R1031 Right lower quadrant pain: Secondary | ICD-10-CM | POA: Diagnosis present

## 2022-02-08 DIAGNOSIS — N390 Urinary tract infection, site not specified: Secondary | ICD-10-CM | POA: Insufficient documentation

## 2022-02-08 DIAGNOSIS — Z79899 Other long term (current) drug therapy: Secondary | ICD-10-CM | POA: Diagnosis not present

## 2022-02-08 DIAGNOSIS — J9811 Atelectasis: Secondary | ICD-10-CM | POA: Diagnosis not present

## 2022-02-08 LAB — CBC WITH DIFFERENTIAL/PLATELET
Abs Immature Granulocytes: 0.04 10*3/uL (ref 0.00–0.07)
Basophils Absolute: 0.1 10*3/uL (ref 0.0–0.1)
Basophils Relative: 1 %
Eosinophils Absolute: 0 10*3/uL (ref 0.0–0.5)
Eosinophils Relative: 0 %
HCT: 43.8 % (ref 36.0–46.0)
Hemoglobin: 14.4 g/dL (ref 12.0–15.0)
Immature Granulocytes: 0 %
Lymphocytes Relative: 17 %
Lymphs Abs: 1.8 10*3/uL (ref 0.7–4.0)
MCH: 29.3 pg (ref 26.0–34.0)
MCHC: 32.9 g/dL (ref 30.0–36.0)
MCV: 89 fL (ref 80.0–100.0)
Monocytes Absolute: 0.6 10*3/uL (ref 0.1–1.0)
Monocytes Relative: 6 %
Neutro Abs: 7.9 10*3/uL — ABNORMAL HIGH (ref 1.7–7.7)
Neutrophils Relative %: 76 %
Platelets: 413 10*3/uL — ABNORMAL HIGH (ref 150–400)
RBC: 4.92 MIL/uL (ref 3.87–5.11)
RDW: 12.6 % (ref 11.5–15.5)
WBC: 10.4 10*3/uL (ref 4.0–10.5)
nRBC: 0 % (ref 0.0–0.2)

## 2022-02-08 LAB — RESP PANEL BY RT-PCR (FLU A&B, COVID) ARPGX2
Influenza A by PCR: NEGATIVE
Influenza B by PCR: NEGATIVE
SARS Coronavirus 2 by RT PCR: NEGATIVE

## 2022-02-08 LAB — URINALYSIS, ROUTINE W REFLEX MICROSCOPIC
Bilirubin Urine: NEGATIVE
Glucose, UA: NEGATIVE mg/dL
Hgb urine dipstick: NEGATIVE
Ketones, ur: NEGATIVE mg/dL
Nitrite: NEGATIVE
Protein, ur: NEGATIVE mg/dL
Specific Gravity, Urine: 1.005 (ref 1.005–1.030)
pH: 7 (ref 5.0–8.0)

## 2022-02-08 LAB — I-STAT BETA HCG BLOOD, ED (MC, WL, AP ONLY): I-stat hCG, quantitative: <5 m[IU]/mL (ref <5)

## 2022-02-08 LAB — COMPREHENSIVE METABOLIC PANEL
ALT: 23 U/L (ref 0–44)
AST: 25 U/L (ref 15–41)
Albumin: 4.2 g/dL (ref 3.5–5.0)
Alkaline Phosphatase: 62 U/L (ref 38–126)
Anion gap: 11 (ref 5–15)
BUN: 14 mg/dL (ref 6–20)
CO2: 22 mmol/L (ref 22–32)
Calcium: 10 mg/dL (ref 8.9–10.3)
Chloride: 110 mmol/L (ref 98–111)
Creatinine, Ser: 0.83 mg/dL (ref 0.44–1.00)
GFR, Estimated: >60 mL/min (ref >60)
Glucose, Bld: 112 mg/dL — ABNORMAL HIGH (ref 70–99)
Potassium: 4.7 mmol/L (ref 3.5–5.1)
Sodium: 143 mmol/L (ref 135–145)
Total Bilirubin: 0.6 mg/dL (ref 0.3–1.2)
Total Protein: 7.4 g/dL (ref 6.5–8.1)

## 2022-02-08 LAB — LIPASE, BLOOD: Lipase: 28 U/L (ref 11–51)

## 2022-02-08 LAB — PREGNANCY, URINE: Preg Test, Ur: NEGATIVE

## 2022-02-08 MED ORDER — IBUPROFEN 800 MG PO TABS: 800.0000 mg | ORAL_TABLET | Freq: Three times a day (TID) | ORAL | 0 refills | Status: AC

## 2022-02-08 MED ORDER — CEPHALEXIN 500 MG PO CAPS: 500.0000 mg | ORAL_CAPSULE | Freq: Two times a day (BID) | ORAL | 0 refills | Status: AC

## 2022-02-08 MED ORDER — HYDROCODONE-ACETAMINOPHEN 5-325 MG PO TABS
1.0000 | ORAL_TABLET | Freq: Once | ORAL | Status: AC
  Administered 2022-02-08: 1 via ORAL
  Filled 2022-02-08: qty 1

## 2022-02-08 MED ORDER — LORAZEPAM 1 MG PO TABS
1.0000 mg | ORAL_TABLET | Freq: Once | ORAL | Status: AC
Start: 2022-02-08 — End: 2022-02-08
  Administered 2022-02-08: 1 mg via ORAL
  Filled 2022-02-08: qty 1

## 2022-02-08 MED ORDER — METHOCARBAMOL 500 MG PO TABS: 1000.0000 mg | ORAL_TABLET | Freq: Two times a day (BID) | ORAL | 0 refills | Status: AC

## 2022-02-08 MED ORDER — ACETAMINOPHEN 325 MG PO TABS: 650.0000 mg | ORAL_TABLET | Freq: Four times a day (QID) | ORAL | 0 refills | Status: AC | PRN

## 2022-02-08 MED ORDER — LORAZEPAM 2 MG/ML IJ SOLN
2.0000 mg | Freq: Once | INTRAMUSCULAR | Status: AC
Start: 2022-02-08 — End: 2022-02-08
  Administered 2022-02-08: 2 mg via INTRAVENOUS
  Filled 2022-02-08: qty 1

## 2022-02-08 MED ORDER — DIAZEPAM 5 MG PO TABS
10.0000 mg | ORAL_TABLET | Freq: Once | ORAL | Status: AC
Start: 2022-02-08 — End: 2022-02-08
  Administered 2022-02-08: 10 mg via ORAL
  Filled 2022-02-08: qty 2

## 2022-02-08 MED ORDER — IOHEXOL 300 MG/ML  SOLN
100.0000 mL | Freq: Once | INTRAMUSCULAR | Status: AC | PRN
  Administered 2022-02-08: 100 mL via INTRAVENOUS

## 2022-02-08 NOTE — Discharge Instructions (Addendum)

## 2022-02-08 NOTE — ED Provider Triage Note (Signed)
Emergency Medicine Provider Triage Evaluation Note ? ?Colleen Fuller , a 26 y.o. female  was evaluated in triage.  Pt complains of abdominal pain and nausea that started last night.  No history of abdominal surgery.  No changes in her diet.  Last menstrual period last week.  No urinary symptoms. ? ?Patient extremely anxious, crying and hyperventilating in triage.  Ativan ordered. ? ?Review of Systems  ?Positive: As above ?Negative: Changes in bowel movements, fever or chills ? ?Physical Exam  ?BP (!) 125/101   Pulse (!) 102   Temp 98 ?F (36.7 ?C) (Oral)   Resp (!) 28   LMP 01/30/2022   SpO2 100%  ?Gen:   Awake, no distress   ?Resp:  Normal effort  ?MSK:   Moves extremities without difficulty  ?Other:  McBurney's and Murphy's negative.  Guarding on right side of abdomen, however patient very difficult to assess due to anxiety, crying and hyperventilating ? ?Medical Decision Making  ?Medically screening exam initiated at 9:33 AM.  Appropriate orders placed.  Colleen Fuller was informed that the remainder of the evaluation will be completed by another provider, this initial triage assessment does not replace that evaluation, and the importance of remaining in the ED until their evaluation is complete. ? ? ? ?Blood work and CT ordered.  Patient likely requires Ativan before either ?  ?Saddie Benders, PA-C ?02/08/22 [id number] ? ?

## 2022-02-08 NOTE — ED Triage Notes (Signed)
Pt. Stated, ive had rt. Lower abdominal pain since last night, ?

## 2022-02-08 NOTE — ED Notes (Signed)
Pt verbalized understanding of d/c instructions, meds, and followup care. Denies questions. VSS, no distress noted. Steady gait to wheelchair. Assisted out to lobby and left with mother. ?

## 2022-02-08 NOTE — ED Provider Notes (Signed)
?[facility] EMERGENCY DEPARTMENT ?Provider Note ? ? ?CSN: [id number] ?Arrival date & time: 02/08/22  0938 ? ?  ? ?History ? ?Chief Complaint  ?Patient presents with  ? Abdominal Pain  ? ? ?Colleen Fuller is a 26 y.o. female. ? ?This is a 26 y.o. female with significant medical history as below, including anxiety, ADHD who presents to the ED with complaint of right lower quadrant pain. ? ?Location: RLQ/right flank ?Duration: Last night ?Onset: Sudden ?Timing: Gradually worsening ?Description: Sharp, stabbing, aching pain ?Severity: Moderate ?Exacerbating/Alleviating Factors: Unable to identify ?Associated Symptoms: She has nausea without vomiting.  Right-sided abdominal pain.  Gradually worsening since onset.  Poor appetite this morning, nausea without emesis.   ? ?No change in bowel or bladder function.  No fevers or chills.  No recent travel or sick contacts.  No trauma.  Last menstrual period was WNL. ? ?Patient is highly anxious regarding healthcare environments, typically has to take Xanax prior to doctor appointments. ? ? ? ?Past Medical History: ?No date: ADHD ?No date: Allergy ?No date: Anxiety ? ?Past Surgical History: ?No date: TONSILLECTOMY AND ADENOIDECTOMY; Bilateral ?    Comment:  age 89  ? ? ?The history is provided by the patient and a relative. No language interpreter was used.  ?Abdominal Pain ?Associated symptoms: nausea   ?Associated symptoms: no chest pain, no chills, no cough, no fever, no hematuria, no shortness of breath and no vomiting   ? ?  ? ?Home Medications ?Prior to Admission medications   ?Medication Sig Start Date End Date Taking? Authorizing Provider  ?acetaminophen (TYLENOL) 325 MG tablet Take 2 tablets (650 mg total) by mouth every 6 (six) hours as needed. 02/08/22  Yes Sloan Leiter, DO  ?ALPRAZolam (XANAX) 0.5 MG tablet Take 1 tablet (0.5 mg total) by mouth daily as needed for anxiety. 07/12/20  Yes Willow Ora, MD  ?cephALEXin (KEFLEX) 500 MG capsule Take  1 capsule (500 mg total) by mouth 2 (two) times daily for 7 days. 02/08/22 02/15/22 Yes Tanda Rockers A, DO  ?ibuprofen (ADVIL) 800 MG tablet Take 1 tablet (800 mg total) by mouth 3 (three) times daily. Take with food 02/08/22  Yes Tanda Rockers A, DO  ?methocarbamol (ROBAXIN) 500 MG tablet Take 2 tablets (1,000 mg total) by mouth 2 (two) times daily for 5 days. 02/08/22 02/13/22 Yes Sloan Leiter, DO  ?   ? ?Allergies    ?Patient has no known allergies.   ? ?Review of Systems   ?Review of Systems  ?Constitutional:  Positive for appetite change. Negative for chills and fever.  ?HENT:  Negative for facial swelling and trouble swallowing.   ?Eyes:  Negative for photophobia and visual disturbance.  ?Respiratory:  Negative for cough and shortness of breath.   ?Cardiovascular:  Negative for chest pain and palpitations.  ?Gastrointestinal:  Positive for abdominal pain and nausea. Negative for vomiting.  ?Endocrine: Negative for polydipsia and polyuria.  ?Genitourinary:  Negative for difficulty urinating and hematuria.  ?Musculoskeletal:  Negative for gait problem and joint swelling.  ?Skin:  Negative for pallor and rash.  ?Neurological:  Negative for syncope and headaches.  ?Psychiatric/Behavioral:  Negative for agitation and confusion. The patient is nervous/anxious.   ? ?Physical Exam ?Updated Vital Signs ?BP 121/78   Pulse 95   Temp 98 ?F (36.7 ?C) (Oral)   Resp 16   LMP 01/30/2022   SpO2 98%  ?Physical Exam ?Vitals and nursing note reviewed.  ?Constitutional:   ?  General: She is not in acute distress. ?   Appearance: Normal appearance. She is well-developed. She is obese. She is not ill-appearing.  ?HENT:  ?   Head: Normocephalic and atraumatic.  ?   Right Ear: External ear normal.  ?   Left Ear: External ear normal.  ?   Nose: Nose normal.  ?   Mouth/Throat:  ?   Mouth: Mucous membranes are moist.  ?Eyes:  ?   General: No scleral icterus.    ?   Right eye: No discharge.     ?   Left eye: No discharge.   ?Cardiovascular:  ?   Rate and Rhythm: Regular rhythm. Tachycardia present.  ?   Pulses: Normal pulses.  ?   Heart sounds: Normal heart sounds.  ?Pulmonary:  ?   Effort: Pulmonary effort is normal. No respiratory distress.  ?   Breath sounds: Normal breath sounds.  ?Abdominal:  ?   General: Abdomen is flat.  ?   Palpations: Abdomen is soft.  ?   Tenderness: There is abdominal tenderness in the right lower quadrant. There is no guarding or rebound.  ?Musculoskeletal:     ?   General: Normal range of motion.  ?   Cervical back: Normal range of motion.  ?   Right lower leg: No edema.  ?   Left lower leg: No edema.  ?Skin: ?   General: Skin is warm and dry.  ?   Capillary Refill: Capillary refill takes less than 2 seconds.  ?Neurological:  ?   Mental Status: She is alert.  ?Psychiatric:     ?   Mood and Affect: Mood is anxious.     ?   Behavior: Behavior normal.  ? ? ?ED Results / Procedures / Treatments   ?Labs ?(all labs ordered are listed, but only abnormal results are displayed) ?Labs Reviewed  ?COMPREHENSIVE METABOLIC PANEL - Abnormal; Notable for the following components:  ?    Result Value  ? Glucose, Bld 112 (*)   ? All other components within normal limits  ?CBC WITH DIFFERENTIAL/PLATELET - Abnormal; Notable for the following components:  ? Platelets 413 (*)   ? Neutro Abs 7.9 (*)   ? All other components within normal limits  ?URINALYSIS, ROUTINE W REFLEX MICROSCOPIC - Abnormal; Notable for the following components:  ? Color, Urine STRAW (*)   ? APPearance HAZY (*)   ? Leukocytes,Ua MODERATE (*)   ? Bacteria, UA FEW (*)   ? All other components within normal limits  ?RESP PANEL BY RT-PCR (FLU A&B, COVID) ARPGX2  ?URINE CULTURE  ?LIPASE, BLOOD  ?PREGNANCY, URINE  ?I-STAT BETA HCG BLOOD, ED ([facility], [facility], [facility] ONLY)  ? ? ?EKG ?None ? ?Radiology ?CT ABDOMEN PELVIS W CONTRAST ? ?Result Date: 02/08/2022 ?CLINICAL DATA:  A 26 year old female presents for evaluation of abdominal pain which is acute and nonlocalized. EXAM: CT  ABDOMEN AND PELVIS WITH CONTRAST TECHNIQUE: Multidetector CT imaging of the abdomen and pelvis was performed using the standard protocol following bolus administration of intravenous contrast. RADIATION DOSE REDUCTION: This exam was performed according to the departmental dose-optimization program which includes automated exposure control, adjustment of the mA and/or kV according to patient size and/or use of iterative reconstruction technique. CONTRAST:  100mL OMNIPAQUE IOHEXOL 300 MG/ML  SOLN COMPARISON:  None FINDINGS: Lower chest: Minimal basilar atelectasis. No signs of effusion or of consolidation. Hepatobiliary: No focal, suspicious hepatic lesion. No pericholecystic stranding. No biliary duct dilation. Portal vein is patent. Pancreas:  Normal, without mass, inflammation or ductal dilatation. Spleen: Normal size and contour without focal lesion. Adjacent splenule. Adrenals/Urinary Tract: Adrenal glands are unremarkable. Symmetric renal enhancement. No sign of hydronephrosis. No suspicious renal lesion or perinephric stranding. Urinary bladder is grossly unremarkable. Moderate distension of the urinary bladder. Mild fullness of the LEFT ureter as it crosses the sacral promontory may be due to ureteral compression there is no visible calculus on contrasted imaging. No perivesical stranding. No frank hydronephrosis. Symmetric bilateral renal enhancement. Normal adrenal glands. Urinary bladder measuring 12 x 10 x 10 (volume = 630) cc cm Stomach/Bowel: Stomach is under distended. No signs of bowel obstruction or acute bowel process. The appendix is normal. Vascular/Lymphatic: Aorta with smooth contours. IVC with smooth contours. No aneurysmal dilation of the abdominal aorta. There is no gastrohepatic or hepatoduodenal ligament lymphadenopathy. No retroperitoneal or mesenteric lymphadenopathy. No pelvic sidewall lymphadenopathy. Reproductive: Unremarkable by CT. No adnexal masses. No substantial fluid in the pelvis.  Other: Small fat containing umbilical hernia. Musculoskeletal: No acute musculoskeletal process. No destructive bone finding. IMPRESSION: 1. No signs of bowel obstruction or acute bowel process. 2. Moderate distension

## 2022-02-08 NOTE — ED Notes (Signed)
;  pt would like for anxiety medication  ?to kick in before blood draw.RN AWARE ?

## 2022-02-08 NOTE — ED Notes (Signed)
CT called to update on patient status. CT with be getting patient shortly. ?

## 2022-02-08 NOTE — ED Notes (Signed)
Pt still feeling very anxious at this time, very heightened alertness. Tearing up when explaining plan of care and need to gain IV access with lab work. Notified Dr. Wallace Cullens.  ?

## 2022-02-08 NOTE — ED Notes (Signed)
Unable to tolerate CT scan. Won't allow use of IV and cannot tolerate it being touched. ?

## 2022-02-08 NOTE — ED Notes (Signed)
Transported to CT 

## 2022-02-08 NOTE — ED Notes (Signed)
Micro called to add on urine culture. Micro confirms sample is available. ?

## 2022-02-08 NOTE — ED Notes (Signed)
Difficulty starting IV. Patient is VERY anxious. Multiple medications given. This RN needs assistance from family and one other RN to insert IV. ?

## 2022-02-08 NOTE — ED Notes (Signed)
Performed bladder scan on pt, most she had left was 87 ml. RN and DR notified.  ?

## 2022-02-09 LAB — URINE CULTURE: Culture: <10000 — AB

## 2022-02-10 ENCOUNTER — Ambulatory Visit (INDEPENDENT_AMBULATORY_CARE_PROVIDER_SITE_OTHER): Payer: 59 | Admitting: Family Medicine

## 2022-02-10 ENCOUNTER — Encounter: Payer: Self-pay | Admitting: Family Medicine

## 2022-02-10 VITALS — BP 130/80 | HR 81 | Temp 98.6°F | Ht 66.0 in | Wt 232.5 lb

## 2022-02-10 DIAGNOSIS — F410 Panic disorder [episodic paroxysmal anxiety] without agoraphobia: Secondary | ICD-10-CM | POA: Diagnosis not present

## 2022-02-10 DIAGNOSIS — N390 Urinary tract infection, site not specified: Secondary | ICD-10-CM | POA: Diagnosis not present

## 2022-02-10 DIAGNOSIS — R1031 Right lower quadrant pain: Secondary | ICD-10-CM | POA: Diagnosis not present

## 2022-02-10 LAB — POCT URINALYSIS DIPSTICK
Bilirubin, UA: NEGATIVE
Blood, UA: NEGATIVE
Glucose, UA: NEGATIVE
Ketones, UA: NEGATIVE
Nitrite, UA: NEGATIVE
Protein, UA: NEGATIVE
Spec Grav, UA: 1.01 (ref 1.010–1.025)
Urobilinogen, UA: 0.2 E.U./dL
pH, UA: 6 (ref 5.0–8.0)

## 2022-02-10 MED ORDER — HYDROXYZINE HCL 50 MG PO TABS: 50.0000 mg | ORAL_TABLET | Freq: Three times a day (TID) | ORAL | 0 refills | Status: AC | PRN

## 2022-02-10 NOTE — Patient Instructions (Signed)
It was very nice to see you today! ? ?Hydroxyzine for anxiety as well.  Finish cephalaxin.  Worse, pain continues-to ER.  Let us know Monday how doing. ? ? ?PLEASE NOTE: ? ?If you had any lab tests please let us know if you have not heard back within a few days. You may see your results on MyChart before we have a chance to review them but we will give you a call once they are reviewed by [place]. If we ordered any referrals today, please let us know if you have not heard from their office within the next week.  ? ?Please try these tips to maintain a healthy lifestyle: ? ?Eat most of your calories during the day when you are active. Eliminate processed foods including packaged sweets (pies, cakes, cookies), reduce intake of potatoes, white bread, white pasta, and white rice. Look for whole grain options, oat flour or almond flour. ? ?Each meal should contain half fruits/vegetables, one quarter protein, and one quarter carbs (no bigger than a computer mouse). ? ?Cut down on sweet beverages. This includes juice, soda, and sweet tea. Also watch fruit intake, though this is a healthier sweet option, it still contains natural sugar! Limit to 3 servings daily. ? ?Drink at least 1 glass of water with each meal and aim for at least 8 glasses per day ? ?Exercise at least 150 minutes every week.   ?

## 2022-02-10 NOTE — Progress Notes (Signed)
? ?Subjective:  ? ? ? Patient ID: Colleen Fuller, female    DOB: Apr 07, 1996, 26 y.o.   MRN: [id number] ? ?Chief Complaint  ?Patient presents with  ? Urinary Tract Infection  ?  Really bad UTI, went to ER, was told to follow-up on Friday ?  ? ? ?HPI-here w/friend and boyfriend ?Seen in ER on 02/08/22 for RLQ abd pain-labs and CT ok.  Told uti and placed on abx-Keflex.  Culture neg.  Did work yesterday to "keep mind off pain" ?Pain some better but still hurts to walk or going over bumps.  No n/v/d/c/f/c.  No vag d/c/dysuria.  LMP 1 wk ago.  Not SA for 4 yrs and tested for STI's after that Episode. ?Wasn't drinking much water prior to this, now drinking 8-10 bottles/day.  Had poor appetite but ate some yesterday. ? ?Pt has EXTREME anxiety coming to medical places/appts.  O/w, copes ok w/anxiety.  Freq gets to front door for appt but can't come in.  Had to get a lot of meds to even get labs and CT done.(10mg  valium, 2mg  ativan orally and 2mg  lorazepam IV)  Had to take meds to come here for f/u.  ?.   ?Health Maintenance Due  ?Topic Date Due  ? Hepatitis C Screening  Never done  ? TETANUS/TDAP  Never done  ? PAP-Cervical Cytology Screening  Never done  ? PAP SMEAR-Modifier  Never done  ? HPV VACCINES (3 - 3-dose series) 08/21/2021  ? ? ?Past Medical History:  ?Diagnosis Date  ? ADHD   ? Allergy   ? Anxiety   ? ? ?Past Surgical History:  ?Procedure Laterality Date  ? TONSILLECTOMY AND ADENOIDECTOMY Bilateral   ? age 52  ? ? ?Outpatient Medications Prior to Visit  ?Medication Sig Dispense Refill  ? acetaminophen (TYLENOL) 325 MG tablet Take 2 tablets (650 mg total) by mouth every 6 (six) hours as needed. 36 tablet 0  ? ALPRAZolam (XANAX) 0.5 MG tablet Take 1 tablet (0.5 mg total) by mouth daily as needed for anxiety. 30 tablet 0  ? cephALEXin (KEFLEX) 500 MG capsule Take 1 capsule (500 mg total) by mouth 2 (two) times daily for 7 days. 14 capsule 0  ? ibuprofen (ADVIL) 800 MG tablet Take 1 tablet (800 mg total) by mouth 3  (three) times daily. Take with food 21 tablet 0  ? methocarbamol (ROBAXIN) 500 MG tablet Take 2 tablets (1,000 mg total) by mouth 2 (two) times daily for 5 days. 20 tablet 0  ? ?No facility-administered medications prior to visit.  ? ? ?No Known Allergies ?ROS neg/noncontributory except as noted HPI/below ? ? ?   ?Objective:  ?  ? ?BP 130/80   Pulse 81   Temp 98.6 ?F (37 ?C) (Temporal)   Ht 5\' 6"  (1.676 m)   Wt 232 lb 8 oz (105.5 kg)   LMP 01/30/2022   SpO2 99%   BMI 37.53 kg/m?  ?Wt Readings from Last 3 Encounters:  ?02/10/22 232 lb 8 oz (105.5 kg)  ?10/26/20 225 lb 9.6 oz (102.3 kg)  ?07/12/20 221 lb (100.2 kg)  ? ? ?Physical Exam  ? ?Gen: WDWN NAD.  OWF-very anxious-hyperventilating ?HEENT: NCAT, conjunctiva not injected, sclera nonicteric ?NECK:  supple, no thyromegaly, no nodes, no carotid bruits ?CARDIAC:  tachy RRR, S1S2+, no murmur. DP 2+B ?LUNGS: CTAB. No wheezes ?ABDOMEN:  BS+, soft, sl tender diffusely but mod tender RLQ.  Obturator neg but pain w/forced flex R hip. No r/g.  No  HSM, no masses ?EXT:  no edema ?MSK: no gross abnormalities.  ?NEURO: A&O x3.  CN II-XII intact.  ?PSYCH: normal mood. Good eye contact ? ?Reviewed ER records WBC 10, CMP WNL, UA-WBC mod-micro 6-10, few bacteria, U cx <10K colonies, istat HCG neg.   CT neg x bladder distention that resolved post void ? ?Results for orders placed or performed in visit on 02/10/22  ?POCT urinalysis dipstick  ?Result Value Ref Range  ? Color, UA YELLOW   ? Clarity, UA CLEAR   ? Glucose, UA Negative Negative  ? Bilirubin, UA NEGATIVE   ? Ketones, UA NEGATIVE   ? Spec Grav, UA 1.010 1.010 - 1.025  ? Blood, UA NEGATIVE   ? pH, UA 6.0 5.0 - 8.0  ? Protein, UA Negative Negative  ? Urobilinogen, UA 0.2 0.2 or 1.0 E.U./dL  ? Nitrite, UA NEGATIVE   ? Leukocytes, UA Trace (A) Negative  ? Appearance    ? Odor    ? ? ? ? ? ?   ?Assessment & Plan:  ? ?Problem List Items Addressed This Visit   ? ?  ? Other  ? Panic attacks  ? Relevant Medications  ?  hydrOXYzine (ATARAX) 50 MG tablet  ? ?Other Visit Diagnoses   ? ? RLQ abdominal pain    -  Primary  ? Urinary tract infection without hematuria, site unspecified      ? Relevant Orders  ? POCT urinalysis dipstick (Completed)  ? ?  ? RLQ pain-had neg CT and labs in ER x UA looked +.  However, cx was neg.  On Keflex.  Still pain.  Considered STI but pt not SA in 4 yrs.  Could be pyelo.  Cont Abx.  If not improving, worse-ER.  Consider pelvic u/s and w/u if not resolving.  ?UTI?  Repeat UA improved.  Still trace WBC-finish abx.  Return if not resolving ?Severe anxiety w/medical-can do hydroxyzine as well.  She is lightheaded/tingly from hyperventilating.  Didn't want w/c-left w/physical support of BF and female friend(:other mother") ?Spent  total w/them-reviewing records, getting other sensitive history, trying to calm her, devising/discussing plan ? ?Meds ordered this encounter  ?Medications  ? hydrOXYzine (ATARAX) 50 MG tablet  ?  Sig: Take 1 tablet (50 mg total) by mouth 3 (three) times daily as needed for anxiety.  ?  Dispense:  30 tablet  ?  Refill:  0  ? ? ?Angelena Sole, MD ? ?

## 2022-02-21 ENCOUNTER — Encounter: Payer: Self-pay | Admitting: Family Medicine

## 2022-07-28 IMAGING — CT CT ABD-PELV W/ CM
2 of 4 series · 16 of 46 positions shown, 18 images · IV contrast (APPLIED)
Comparison: None

CLINICAL DATA: A 25-year-old female presents for evaluation of
abdominal pain which is acute and nonlocalized.

EXAM:
CT ABDOMEN AND PELVIS WITH CONTRAST
TECHNIQUE: Multidetector CT imaging of the abdomen and pelvis was performed
using the standard protocol following bolus administration of
intravenous contrast.

[Series 3: abd/ pelvis 5.0 i30f 2 · axial · 0.98mm/px · z∈[-493,-48]mm · 13 of 99 slices shown, 15 images]
[im 5/99  soft-tissue]
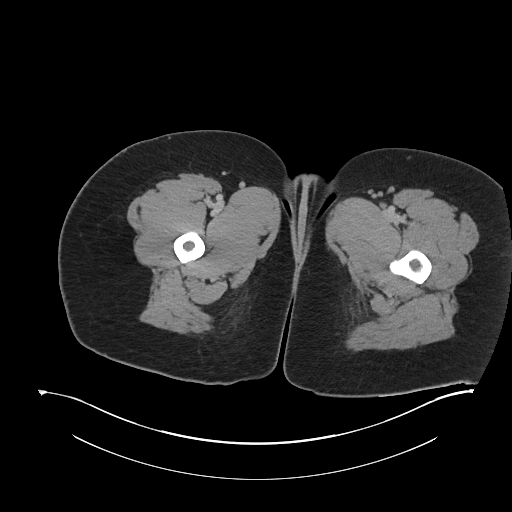
[im 5/99  bone]
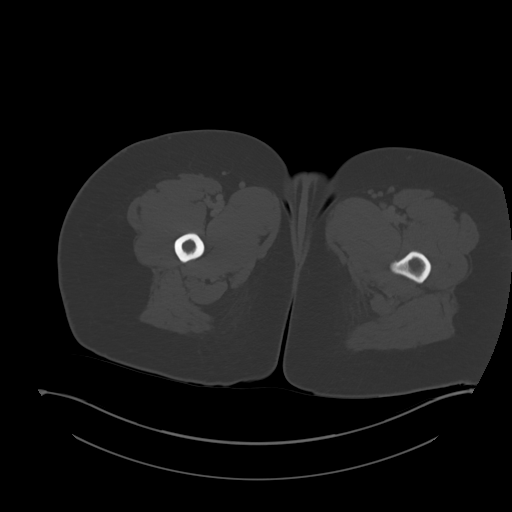
[im 13/99  soft-tissue]
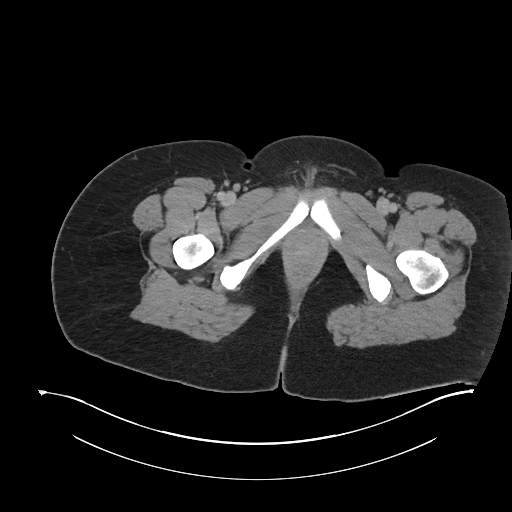
[im 21/99  soft-tissue]
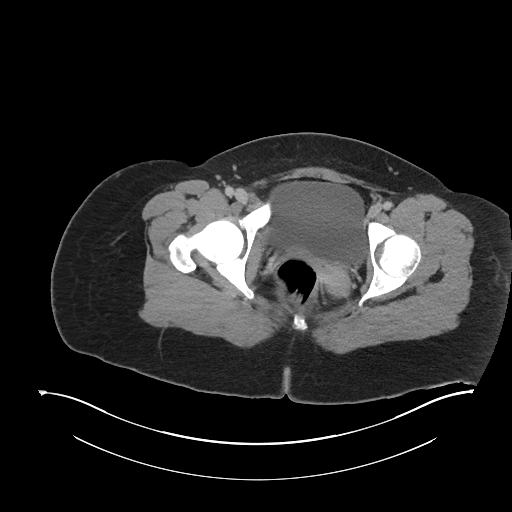
[im 29/99  soft-tissue]
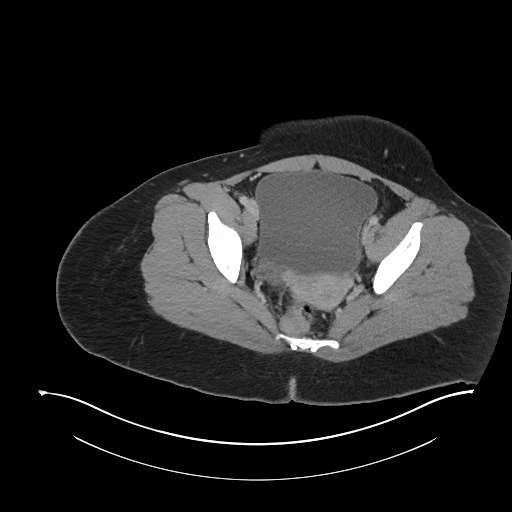
[im 33/99  soft-tissue]
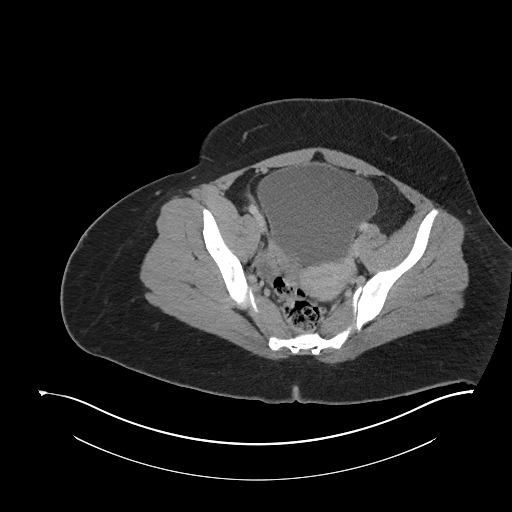
[im 41/99  soft-tissue]
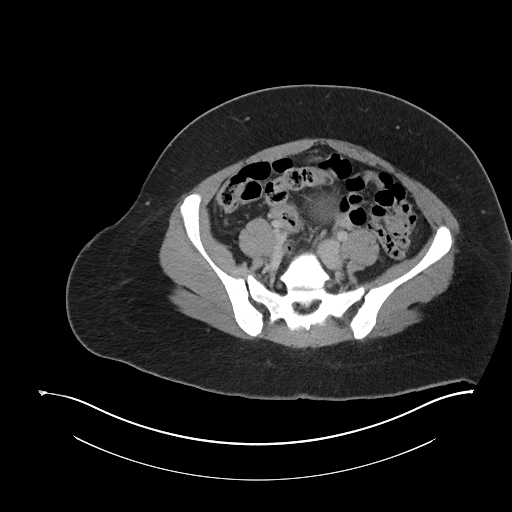
[im 50/99  soft-tissue]
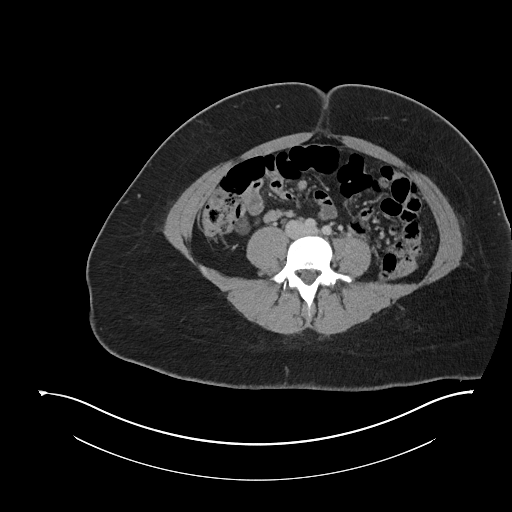
[im 58/99  soft-tissue]
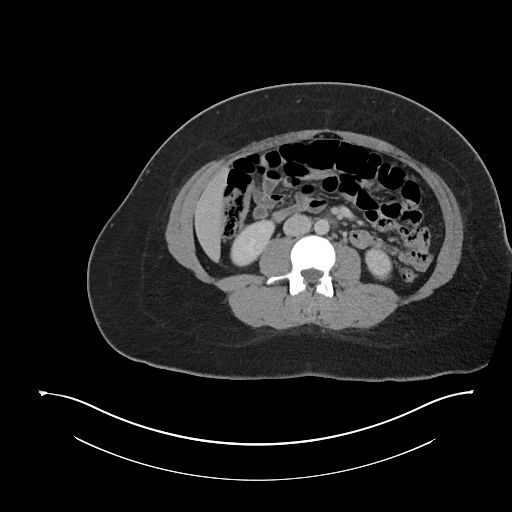
[im 66/99  soft-tissue]
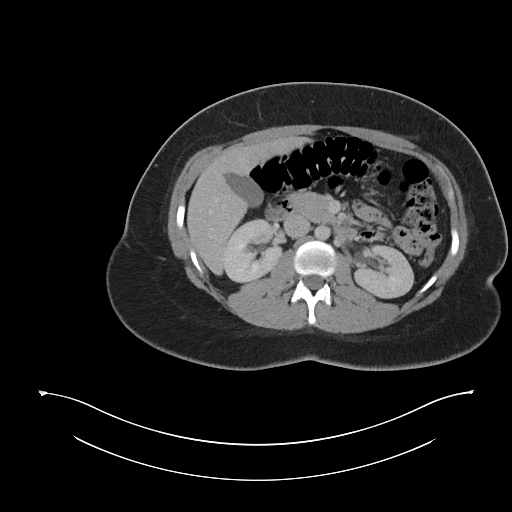
[im 66/99  bone]
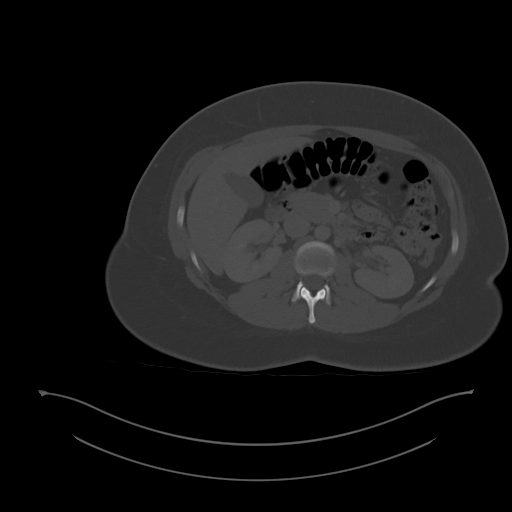
[im 70/99  soft-tissue]
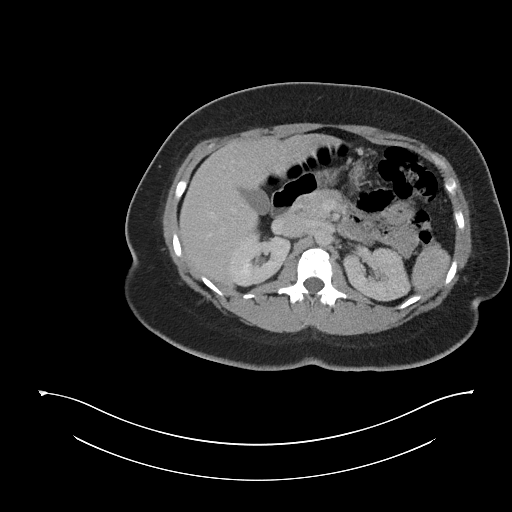
[im 78/99  soft-tissue]
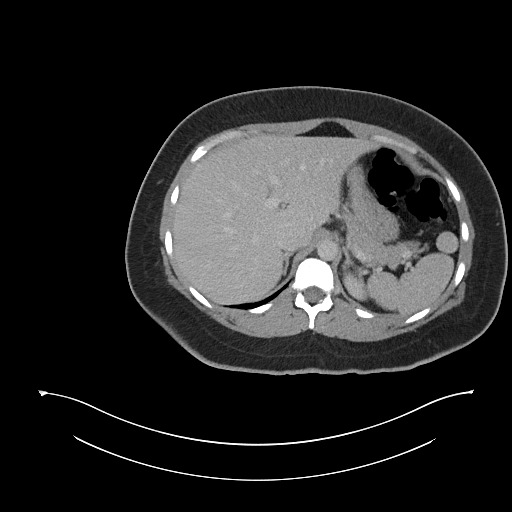
[im 86/99  soft-tissue]
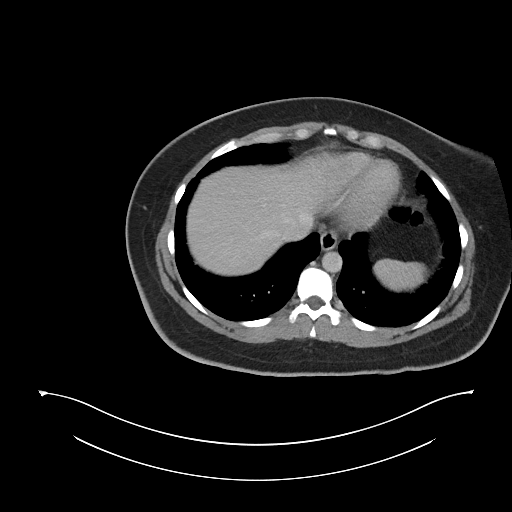
[im 94/99  soft-tissue]
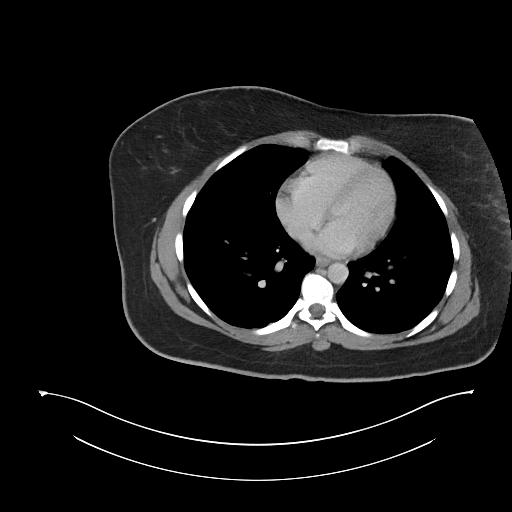

[Series 6: coronal soft tissue · coronal · 0.95mm/px · 3 of 113 slices shown]
[im 38/113  soft-tissue]
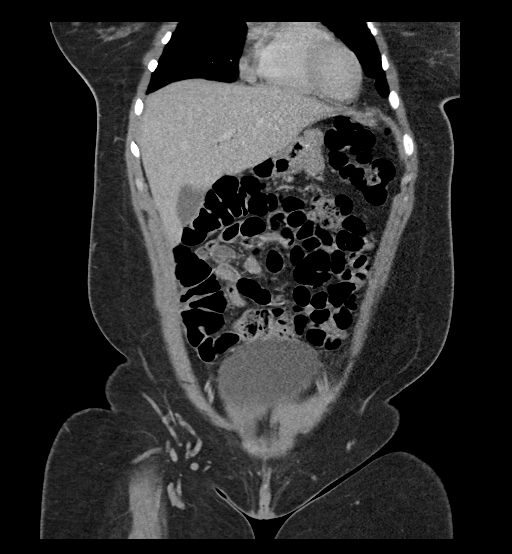
[im 50/113  soft-tissue]
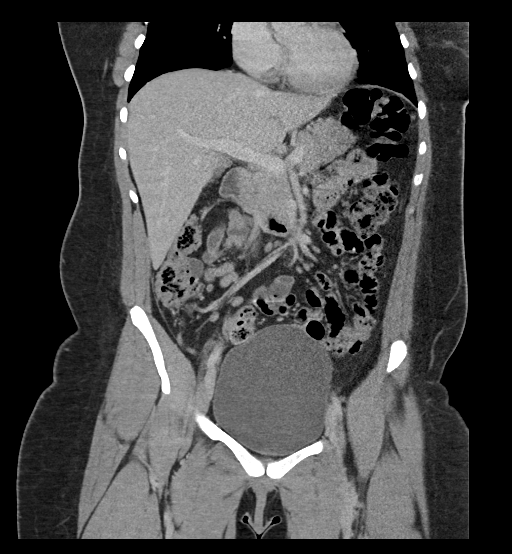
[im 63/113  soft-tissue]
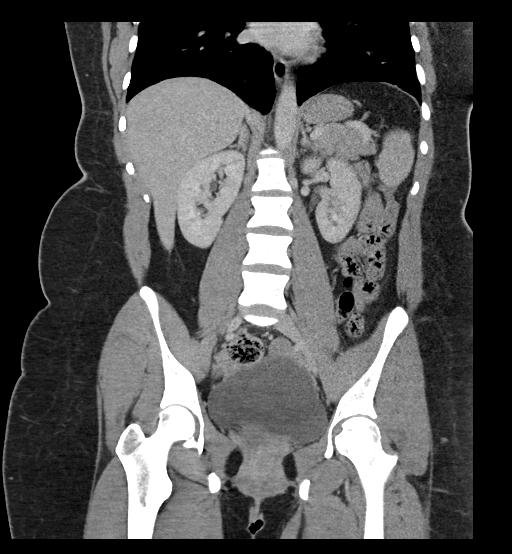

[16 of 46 positions shown; findings below may reference images not displayed]

RADIATION DOSE REDUCTION: This exam was performed according to the
departmental dose-optimization program which includes automated
exposure control, adjustment of the mA and/or kV according to
patient size and/or use of iterative reconstruction technique.

CONTRAST:  100mL OMNIPAQUE IOHEXOL 300 MG/ML  SOLN
FINDINGS: Lower chest: Minimal basilar atelectasis. No signs of effusion or of
consolidation.

Hepatobiliary: No focal, suspicious hepatic lesion. No
pericholecystic stranding. No biliary duct dilation. Portal vein is
patent.

Pancreas: Normal, without mass, inflammation or ductal dilatation.

Spleen: Normal size and contour without focal lesion. Adjacent
splenule.

Adrenals/Urinary Tract:

Adrenal glands are unremarkable. Symmetric renal enhancement. No
sign of hydronephrosis. No suspicious renal lesion or perinephric
stranding.

Urinary bladder is grossly unremarkable. Moderate distension of the
urinary bladder. Mild fullness of the LEFT ureter as it crosses the
sacral promontory may be due to ureteral compression there is no
visible calculus on contrasted imaging. No perivesical stranding. No
frank hydronephrosis. Symmetric bilateral renal enhancement. Normal
adrenal glands.

Urinary bladder measuring 12 x 10 x 10 (volume = 630) cc cm

Stomach/Bowel: Stomach is under distended. No signs of bowel
obstruction or acute bowel process. The appendix is normal.

Vascular/Lymphatic:

Aorta with smooth contours. IVC with smooth contours. No aneurysmal
dilation of the abdominal aorta. There is no gastrohepatic or
hepatoduodenal ligament lymphadenopathy. No retroperitoneal or
mesenteric lymphadenopathy.

No pelvic sidewall lymphadenopathy.

Reproductive: Unremarkable by CT. No adnexal masses. No substantial
fluid in the pelvis.

Other: Small fat containing umbilical hernia.

Musculoskeletal: No acute musculoskeletal process. No destructive
bone finding.
IMPRESSION: 1. No signs of bowel obstruction or acute bowel process.
2. Moderate distension of the urinary bladder up to 600 cc current
volume. Transition of the LEFT ureter crossing the sacral promontory
adjacent to distended urinary bladder favored to represent changes
related to compression in the setting of bladder distension.
Correlate with any signs of urinary retention.
3. Small fat containing umbilical hernia.

## 2022-08-14 ENCOUNTER — Encounter: Payer: Self-pay | Admitting: *Deleted

## 2022-11-02 ENCOUNTER — Encounter: Payer: Self-pay | Admitting: *Deleted

## 2023-03-14 ENCOUNTER — Telehealth: Payer: Self-pay

## 2023-03-14 NOTE — Telephone Encounter (Signed)
Referring provider not in system to add to "Referring Provider" tab for billing purposes. Referring provider is Venia Minks - [facility], [place]. Tele note made for billing, Referral note made. IT ticket put in as to how to add referring provider in and will be added once IT fixes.
